# Patient Record
Sex: Female | Born: 1965 | Race: White | Hispanic: No | Marital: Married | Smoking: Never smoker
Health system: Southern US, Community
[De-identification: ages and names within clinical notes are randomized; demographics above are authoritative.]

## PROBLEM LIST (undated history)

## (undated) DIAGNOSIS — K5730 Diverticulosis of large intestine without perforation or abscess without bleeding: Secondary | ICD-10-CM

## (undated) DIAGNOSIS — R002 Palpitations: Secondary | ICD-10-CM

## (undated) DIAGNOSIS — N950 Postmenopausal bleeding: Secondary | ICD-10-CM

## (undated) DIAGNOSIS — Z860101 Personal history of adenomatous and serrated colon polyps: Secondary | ICD-10-CM

## (undated) DIAGNOSIS — N840 Polyp of corpus uteri: Secondary | ICD-10-CM

## (undated) DIAGNOSIS — C189 Malignant neoplasm of colon, unspecified: Secondary | ICD-10-CM

## (undated) DIAGNOSIS — R5383 Other fatigue: Secondary | ICD-10-CM

## (undated) DIAGNOSIS — Z8719 Personal history of other diseases of the digestive system: Secondary | ICD-10-CM

## (undated) DIAGNOSIS — Z86010 Personal history of colonic polyps: Secondary | ICD-10-CM

## (undated) DIAGNOSIS — G43909 Migraine, unspecified, not intractable, without status migrainosus: Secondary | ICD-10-CM

## (undated) DIAGNOSIS — IMO0002 Reserved for concepts with insufficient information to code with codable children: Secondary | ICD-10-CM

## (undated) DIAGNOSIS — I10 Essential (primary) hypertension: Secondary | ICD-10-CM

## (undated) HISTORY — DX: Reserved for concepts with insufficient information to code with codable children: IMO0002

## (undated) HISTORY — DX: Migraine, unspecified, not intractable, without status migrainosus: G43.909

## (undated) HISTORY — DX: Malignant neoplasm of colon, unspecified: C18.9

## (undated) HISTORY — PX: COLONOSCOPY: SHX174

## (undated) HISTORY — DX: Other fatigue: R53.83

---

## 1995-01-20 HISTORY — PX: DILATION AND CURETTAGE OF UTERUS: SHX78

## 1998-06-24 ENCOUNTER — Inpatient Hospital Stay (HOSPITAL_COMMUNITY): Admission: AD | Admit: 1998-06-24 | Discharge: 1998-06-24 | Payer: Self-pay | Admitting: Obstetrics and Gynecology

## 1998-07-11 ENCOUNTER — Inpatient Hospital Stay (HOSPITAL_COMMUNITY): Admission: AD | Admit: 1998-07-11 | Discharge: 1998-07-15 | Payer: Self-pay | Admitting: *Deleted

## 1998-08-30 ENCOUNTER — Other Ambulatory Visit: Admission: RE | Admit: 1998-08-30 | Discharge: 1998-08-30 | Payer: Self-pay | Admitting: Obstetrics and Gynecology

## 1999-11-21 ENCOUNTER — Other Ambulatory Visit: Admission: RE | Admit: 1999-11-21 | Discharge: 1999-11-21 | Payer: Self-pay | Admitting: Obstetrics and Gynecology

## 2011-11-05 ENCOUNTER — Ambulatory Visit (INDEPENDENT_AMBULATORY_CARE_PROVIDER_SITE_OTHER): Payer: 59 | Admitting: Obstetrics & Gynecology

## 2011-11-05 ENCOUNTER — Encounter: Payer: Self-pay | Admitting: Obstetrics & Gynecology

## 2011-11-05 VITALS — BP 137/103 | HR 95 | Temp 98.6°F | Resp 16 | Ht 62.0 in | Wt 216.0 lb

## 2011-11-05 DIAGNOSIS — Z0000 Encounter for general adult medical examination without abnormal findings: Secondary | ICD-10-CM

## 2011-11-05 DIAGNOSIS — Z01419 Encounter for gynecological examination (general) (routine) without abnormal findings: Secondary | ICD-10-CM

## 2011-11-05 DIAGNOSIS — Z124 Encounter for screening for malignant neoplasm of cervix: Secondary | ICD-10-CM

## 2011-11-05 DIAGNOSIS — Z1151 Encounter for screening for human papillomavirus (HPV): Secondary | ICD-10-CM

## 2011-11-05 DIAGNOSIS — N951 Menopausal and female climacteric states: Secondary | ICD-10-CM

## 2011-11-05 MED ORDER — NORETHIN ACE-ETH ESTRAD-FE 1-20 MG-MCG(24) PO TABS: 1.0000 | ORAL_TABLET | Freq: Every day | ORAL | Status: DC

## 2011-11-05 NOTE — Progress Notes (Signed)
Subjective:    Lauren Norris is a 46 y.o. female who presents for an annual exam. She complains of amenorrhea since 2009. Dr. Marcelle Overlie checked her [facility] and told her she is menopausal. She says that the Vivelle and prometrium were not helpful. The patient is sexually active. GYN screening history: last pap: was normal. The patient wears seatbelts: yes. The patient participates in regular exercise: yes. Has the patient ever been transfused or tattooed?: yes (tattoos). The patient reports that there is not domestic violence in her life.   Menstrual History: OB History    Grav Para Term Preterm Abortions TAB SAB Ect Mult Living   2 1 1  1  1   1       Menarche age: 30 No LMP recorded. Patient is postmenopausal.    The following portions of the patient's history were reviewed and updated as appropriate: allergies, current medications, past family history, past medical history, past social history, past surgical history and problem list.  Review of Systems A comprehensive review of systems was negative. She has been married for 20 years and denies dyspareunia. She is not employed. She previously used OCPs without problems.  Objective:    BP 137/103  Pulse 95  Temp 98.6 F (37 C) (Oral)  Resp 16  Ht 5\' 2"  (1.575 m)  Wt 216 lb (97.977 kg)  BMI 39.51 kg/m2  General Appearance:    Alert, cooperative, no distress, appears stated age  Head:    Normocephalic, without obvious abnormality, atraumatic  Eyes:    PERRL, conjunctiva/corneas clear, EOM's intact, fundi    benign, both eyes  Ears:    Normal TM's and external ear canals, both ears  Nose:   Nares normal, septum midline, mucosa normal, no drainage    or sinus tenderness  Throat:   Lips, mucosa, and tongue normal; teeth and gums normal  Neck:   Supple, symmetrical, trachea midline, no adenopathy;    thyroid:  no enlargement/tenderness/nodules; no carotid   bruit or JVD  Back:     Symmetric, no curvature, ROM normal, no CVA tenderness    Lungs:     Clear to auscultation bilaterally, respirations unlabored  Chest Wall:    No tenderness or deformity   Heart:    Regular rate and rhythm, S1 and S2 normal, no murmur, rub   or gallop  Breast Exam:    No tenderness, masses, or nipple abnormality  Abdomen:     Soft, non-tender, bowel sounds active all four quadrants,    no masses, no organomegaly  Genitalia:    Normal female without lesion, discharge or tenderness, mild atrophy, cervix very anterior in the vagina, Uterus 6-8 week size, NT, mobile, NT adnexa, no masses     Extremities:   Extremities normal, atraumatic, no cyanosis or edema  Pulses:   2+ and symmetric all extremities  Skin:   Skin color, texture, turgor normal, no rashes or lesions  Lymph nodes:   Cervical, supraclavicular, and axillary nodes normal  Neurologic:   CNII-XII intact, normal strength, sensation and reflexes    throughout  .    Assessment:    Healthy female exam.    Plan:     Mammogram. Thin prep Pap smear.  For her menopausal symptoms, I will prescribe ON 1/35. I will recheck her BP in 2 weeks and encourage weight loss.

## 2011-11-19 ENCOUNTER — Ambulatory Visit (INDEPENDENT_AMBULATORY_CARE_PROVIDER_SITE_OTHER): Payer: 59 | Admitting: Obstetrics & Gynecology

## 2011-11-19 ENCOUNTER — Encounter: Payer: Self-pay | Admitting: Obstetrics & Gynecology

## 2011-11-19 VITALS — BP 142/98 | HR 89 | Temp 98.5°F | Resp 16 | Ht 62.0 in | Wt 217.0 lb

## 2011-11-19 DIAGNOSIS — R232 Flushing: Secondary | ICD-10-CM

## 2011-11-19 DIAGNOSIS — I10 Essential (primary) hypertension: Secondary | ICD-10-CM

## 2011-11-19 DIAGNOSIS — N951 Menopausal and female climacteric states: Secondary | ICD-10-CM

## 2011-11-19 DIAGNOSIS — E669 Obesity, unspecified: Secondary | ICD-10-CM

## 2011-11-19 NOTE — Progress Notes (Signed)
  Subjective:    Patient ID: Lauren Norris, female    DOB: February 02, 1965, 46 y.o.   MRN: [id number]  HPI  Mrs. Charters is a 46 yo lady that I started on OCPs for her intractable hot flashes. She says that they are improved, present only on occasions instead of constantly. She would like to stay on them.  Review of Systems  She has gained a significant amount of weight in the last year.    Objective:   Physical Exam        Assessment & Plan:  HTN- I believe that with only a minimal amount of weight loss that her BP will normalize. We have discussed diet and exercise. I will give her a month to lose 5# and recheck her BP. If BP still up, she will have to d/c the OCPs or go on BP med.

## 2011-12-16 ENCOUNTER — Ambulatory Visit: Payer: 59 | Admitting: Obstetrics & Gynecology

## 2012-01-20 DIAGNOSIS — C189 Malignant neoplasm of colon, unspecified: Secondary | ICD-10-CM

## 2012-01-20 HISTORY — DX: Malignant neoplasm of colon, unspecified: C18.9

## 2012-07-15 ENCOUNTER — Other Ambulatory Visit: Payer: Self-pay | Admitting: Gastroenterology

## 2012-08-05 ENCOUNTER — Encounter (INDEPENDENT_AMBULATORY_CARE_PROVIDER_SITE_OTHER): Payer: Self-pay

## 2012-08-08 ENCOUNTER — Ambulatory Visit (INDEPENDENT_AMBULATORY_CARE_PROVIDER_SITE_OTHER): Payer: Medicaid Other | Admitting: General Surgery

## 2012-08-08 ENCOUNTER — Encounter (INDEPENDENT_AMBULATORY_CARE_PROVIDER_SITE_OTHER): Payer: Self-pay | Admitting: General Surgery

## 2012-08-08 ENCOUNTER — Telehealth (INDEPENDENT_AMBULATORY_CARE_PROVIDER_SITE_OTHER): Payer: Self-pay

## 2012-08-08 VITALS — BP 144/84 | HR 85 | Temp 97.1°F | Ht 62.0 in | Wt 236.2 lb

## 2012-08-08 DIAGNOSIS — K6389 Other specified diseases of intestine: Secondary | ICD-10-CM

## 2012-08-08 NOTE — Patient Instructions (Signed)
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 This is either cancerous or precancerous, and you will need to have an operation to have this removed.  You will be scheduled for a CT scan of the abdomen and pelvis preop.  You will need to undergo a bowel prep preop, which will include a mechanical laxative as well as antibiotic pills.  You'll be scheduled for surgery to remove this segment of the colon, and most likely you will not need a colostomy, although it is possible.       Laparoscopic Colon Resection Laparoscopic colon resection is a relatively new procedure and is not performed in all centers. It may be done to remove a piece of the colon (large intestine) that may be sore and reddened (inflamed). It may be done to remove a portion of bowel that is blocked. The intestine may be blocked because of colon cancer. It is sometimes used to treat diseases of the bowel in which there are multiple small outgrowths from the bowel wall (polyps), which may predispose a person to cancer. LET YOUR CAREGIVER KNOW ABOUT:  Allergies.  Medications taken including herbs, eye drops, over the counter medications, and creams.  Use of steroids (by mouth or creams).  Previous problems with anesthetics or novocaine.  Possibility of pregnancy, if this applies.  History of blood clots (thrombophlebitis).  History of bleeding or blood problems.  Previous surgery.  Other health problems. RISKS AND COMPLICATIONS Some problems, which occur following this procedure, include:  Infection: A germ starts growing in the wound. This can usually be treated with medicine that kills germs (antibiotics).  Bleeding following surgery may be a complication of almost all surgeries. Your surgeon takes every precaution to keep this from happening.  Damage to other organs may occur. If damage to other organs or excessive bleeding should occur it may be necessary to convert the laparoscopic procedure into an  open abdominal (belly) procedure. This means the surgery is performed by opening the abdomen and performing the surgery under direct vision. Scarring from previous surgeries or disease may also be a cause to change this procedure to an open abdominal operation.  Sometimes a leak can occur in the line where the bowel was sewn together after the portion of bowel was removed.  It is possible for the bowel to become obstructed in the area where it was sewn together. When this happens, it is sometimes necessary to operate again to repair this. This may be accomplished using the laparoscope or opening the abdomen and operating in the usual manner without the laparoscope. BEFORE THE PROCEDURE You should be present 2 hours prior to your procedure or as instructed.  PROCEDURE  Laparoscopic means a laparoscope (a small pencil sized telescope) is used. You are made to sleep with medicine (anesthetized). Your surgeon inflates your belly (abdomen) with a needle like device (trocar and cannula). The inflation is done with a harmless gas (carbon dioxide). This makes your organs easier to see. The laparoscope is inserted into your abdomen through a small slit (incision) that allows your surgeon to see into the abdomen. Other small instruments, such as probes and operating instruments, are inserted into the abdomen through other small openings (ports). These ports allow the surgeon to perform the operation. Often surgeons attach a video camera to the laparoscope to enlarge the view. During the procedure the portion of bowel to be removed is taken out through one of the ports. A port may have to be enlarged if the  bowel is too large to be removed. In this case a small incision will be made and some times the bowel is reconnected (anastamosis) outside the abdomen. After the procedure, the gas is released, and your incisions are closed with stitches (sutures). Because these incisions are small (usually less than one-half inch),  there is usually minimal discomfort following the procedure. AFTER THE PROCEDURE The recovery time, if there are no problems, is shortened compared to regular surgery. You will rest in a recovery room until you are stable and doing well. Following this, barring other problems you will be allowed to return to your room. Recovery times vary depending on what is found at surgery, the age of the patient, general health, etc. SEEK IMMEDIATE MEDICAL CARE IF:   There is redness, swelling, or increasing pain in the wound area.  Pus is coming from the wound.  An unexplained oral temperature above 102 F (38.9 C) develops or as directed.  You notice a foul smell coming from the wound or dressing.  There is a breaking open of a wound (edges not staying together) after sutures have been removed.  You develop increasing abdominal pain. Document Released: 03/28/2002 Document Revised: 03/30/2011 Document Reviewed: 02/04/2007 [facility] Patient Information 2014 [place], [place].      Removal of Colon (Colectomy) Care Before and After Surgery A partial or total colectomy is the removal of part or all of your colon. This is most often done under a general anesthetic so that you are sleeping during the procedure. Following the procedure, you will be taken to a recovery room. Once you have recovered from the anesthetic you will be returned to your room.  LET YOUR CAREGIVERS KNOW ABOUT:  Allergies  Medications taken including herbs, eye drops, over the counter medications, and creams.  Use of steroids (by mouth or creams).  Previous problems with anesthetics or Novocaine.  Possibility of pregnancy, if this applies.  History of blood clots (thrombophlebitis).  History of bleeding or blood problems.  Previous surgery.  Other health problems. AFTER THE PROCEDURE After surgery, you will be taken to the recovery area where a nurse will watch you and check your progress. After the recovery area you  will go to your hospital room. Your surgeon will determine when it is alright for you to take fluids and foods. You will be given pain medicine to keep you comfortable. HOME CARE INSTRUCTIONS  Once home, an ice pack applied to the operative site may help with discomfort and keep swelling down.  Change dressings as directed.  Only take over-the-counter or prescription medicines for pain, discomfort, or fever as directed by your caregiver.  You may continue normal diet and activities as directed.  There should be no heavy lifting (more than 10 pounds), strenuous activities or contact sports for three weeks, or as directed.  Keep the wound dry and clean. The wound may be washed gently with soap and water. Gently blot or dab dry following cleansing without rubbing. Do not take baths, use swimming pools or use hot tubs for ten days, or as instructed by your caregivers.  If you have a colostomy, care for it as you have been shown. SEEK MEDICAL CARE IF:   There is redness, swelling, or increasing pain in the wound area.  Pus is coming from the wound.  An unexplained oral temperature above 101 F (38.3 C) develops.  You notice a foul smell coming from the wound or dressing.  There is a breaking open of a wound (  edges not staying together) after the sutures have been removed.  There is increasing abdominal pain. SEEK IMMEDIATE MEDICAL CARE IF:   A rash develops.  There is difficulty breathing, or development of a reaction or side effects to medications given. Document Released: 10/10/2003 Document Revised: 03/30/2011 Document Reviewed: 02/08/2007 [facility] Patient Information 2014 [place], [place].

## 2012-08-08 NOTE — Telephone Encounter (Signed)
I called and left the pt a message to call me.  Dr Derrell Lolling is running late getting here.  I was going to tell her not to arrive until 4:30 to check in.  There is one pt ahead of her.  I see she is coming from [facility] though.

## 2012-08-08 NOTE — Progress Notes (Addendum)
Patient ID: Lauren Norris, female   DOB: November 29, 1965, 47 y.o.   MRN: [id number]  Chief Complaint  Patient presents with  . New Evaluation    eval colon mass    HPI Lauren Norris is a 47 y.o. female.  She is referred by Dr. Charlott Rakes for evaluation andsurgical management of a neoplastic mass of the transverse colon. Dr. Richarda Overlie is her gynecologist and PCP.  I have operated on her father, Mr. [name], for colon cancer. He is currently doing well following chemotherapy.  This patient does not have any GI symptoms. Specifically no bowel pain cramps nausea vomiting change in bowel habit or blood in her stool. Has occasional heartburn.  Her father had colon cancer at age 73. A paternal uncle was operated on for colon cancer and is currently doing well. Paternal grandmother died of metastatic colon cancer. She was referred for her first screening colonoscopy recently. Dr. Bosie Clos performed colonoscopy to the cecum and describes a polypoid, infiltrated, nonobstructing, large mass in the transverse colon. This was partially circumferential, involving one half of the lumen circumference. This area was tattooed biopsies were taken. 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 This was a solitary mucosal abnormality.  She has had no further testing.  She is generally healthy although has gained a lot of weight in the past year because she lost her job. Her BMI is 42. She has had a C-section. No other medical problems. She is married and has one 40 year old daughter. HPI  Past Medical History  Diagnosis Date  . Fatigue   . Migraine   . Palpitations   . Hot flashes   . Recurrent UTI   . Bulging disc     Past Surgical History  Procedure Laterality Date  . Dilation and curettage of uterus    . Cesarean section  07/12/1998    Family History  Problem Relation Age of Onset  . Cancer Paternal Grandmother     Colon  . Cancer - Colon Father   . Cancer Father    Colon  . Cancer - Other Maternal Aunt     myloma  . Cancer Maternal Aunt     Multi Myeloma  . Cancer - Lung Paternal Grandfather   . Cancer Paternal Grandfather     Lung  . Cancer - Colon Paternal Uncle   . Cancer Paternal Uncle     Colon  . Cancer Maternal Grandmother     Ovarian    Social History History  Substance Use Topics  . Smoking status: Former Games developer  . Smokeless tobacco: Former Neurosurgeon    Quit date: 08/09/1998  . Alcohol Use: Yes     Comment: 2-3 times week    Allergies  Allergen Reactions  . Codeine     Upset stomach    Current Outpatient Prescriptions  Medication Sig Dispense Refill  . alprazolam (XANAX) 2 MG tablet Take 2 mg by mouth daily as needed for sleep.      Marland Kitchen estradiol (VIVELLE-DOT) 0.0375 MG/24HR Place 1 patch onto the skin 2 (two) times a week.       No current facility-administered medications for this visit.    Review of Systems Review of Systems  Constitutional: Positive for unexpected weight change. Negative for fever and chills.  HENT: Negative for hearing loss, congestion, sore throat, trouble swallowing and voice change.   Eyes: Negative for visual disturbance.  Respiratory: Negative for cough and wheezing.   Cardiovascular: Negative for chest pain, palpitations and  leg swelling.  Gastrointestinal: Negative for nausea, vomiting, abdominal pain, diarrhea, constipation, blood in stool, abdominal distention and anal bleeding.       Reflux  Genitourinary: Negative for hematuria, vaginal bleeding and difficulty urinating.  Musculoskeletal: Negative for arthralgias.  Skin: Negative for rash and wound.  Neurological: Positive for headaches. Negative for seizures and syncope.  Hematological: Negative for adenopathy. Does not bruise/bleed easily.  Psychiatric/Behavioral: Negative for confusion. The patient is nervous/anxious.     Blood pressure 144/84, pulse 85, temperature 97.1 F (36.2 C), temperature source Temporal, height 5\' 2"  (1.575  m), weight 236 lb 3.2 oz (107.14 kg), SpO2 98.00%.  Physical Exam Physical Exam  Constitutional: She is oriented to person, place, and time. She appears well-developed and well-nourished. No distress.  BMI 42.06  HENT:  Head: Normocephalic and atraumatic.  Nose: Nose normal.  Mouth/Throat: No oropharyngeal exudate.  Eyes: Conjunctivae and EOM are normal. Pupils are equal, round, and reactive to light. Left eye exhibits no discharge. No scleral icterus.  Neck: Neck supple. No JVD present. No tracheal deviation present. No thyromegaly present.  Cardiovascular: Normal rate, regular rhythm, normal heart sounds and intact distal pulses.   No murmur heard. Pulmonary/Chest: Effort normal and breath sounds normal. No respiratory distress. She has no wheezes. She has no rales. She exhibits no tenderness.  Abdominal: Soft. Bowel sounds are normal. She exhibits no distension and no mass. There is no tenderness. There is no rebound and no guarding.  pfannensteil scar.central obesity.  Musculoskeletal: She exhibits no edema and no tenderness.  Lymphadenopathy:    She has no cervical adenopathy.  Neurological: She is alert and oriented to person, place, and time. She exhibits normal muscle tone. Coordination normal.  Skin: Skin is warm. No rash noted. She is not diaphoretic. No erythema. No pallor.  Psychiatric: She has a normal mood and affect. Her behavior is normal. Judgment and thought content normal.    Data Reviewed Office notes. Colonoscopy report. Pathology report.  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 This should be treated as a carcinoma until proven otherwise.  Strong family history of colon cancer on the father's side.  Significant obesity, BMI 42.06  History Pfannenstiel incision for C-section     Plan    The patient will be scheduled for CT scan of the  abdomen and pelvis this week ADDENDUM:  CT shows mass in mid-transverse colon. No adenopathy or metastatic disease.  She'll be scheduled for laparoscopic assisted transverse colectomy, possible open colectomy in the near future. She is aware of the increased risk of converting to open because of her central obesity and she accepts that.  I have discussed indications, details, techniques, numerous risks of the surgery with her. She is aware of the risk of bleeding, infection, wound healing problems, hernia, injury to adjacent organs, conversion to a major laparotomy with large incision, cardiac, pulmonary, and thromboembolic problems. She understands all these issues and all of her questions are answered. She agrees with this plan.  She will undergo a one-day bowel prep  Consider genetic testing at some point, although not urgent since this is a solitary finding and not yet proven to be cancer.  Weight loss recommended        Adabelle Griffiths M. Derrell Lolling, M.D., [facility] General and Minimally invasive Surgery Breast and Colorectal Surgery Office:   [phone] Pager:   [phone]  08/08/2012, 5:20 PM

## 2012-08-09 ENCOUNTER — Telehealth (INDEPENDENT_AMBULATORY_CARE_PROVIDER_SITE_OTHER): Payer: Self-pay | Admitting: General Surgery

## 2012-08-09 NOTE — Telephone Encounter (Signed)
Spoke with pt and informed her of her CT scan on 08/23/12 at 10:00.  Instructed no solids 4 hrs prior

## 2012-08-23 ENCOUNTER — Ambulatory Visit
Admission: RE | Admit: 2012-08-23 | Discharge: 2012-08-23 | Disposition: A | Discharge status: Home / Self Care | Payer: Medicaid Other | Source: Ambulatory Visit | Attending: General Surgery | Admitting: General Surgery

## 2012-08-23 DIAGNOSIS — K6389 Other specified diseases of intestine: Secondary | ICD-10-CM

## 2012-08-24 ENCOUNTER — Encounter (HOSPITAL_COMMUNITY): Payer: Self-pay | Admitting: Pharmacy Technician

## 2012-08-24 ENCOUNTER — Ambulatory Visit
Admission: RE | Admit: 2012-08-24 | Discharge: 2012-08-24 | Disposition: A | Discharge status: Home / Self Care | Payer: Medicaid Other | Source: Ambulatory Visit | Attending: General Surgery | Admitting: General Surgery

## 2012-08-24 MED ORDER — IOHEXOL 300 MG/ML  SOLN
100.0000 mL | Freq: Once | INTRAMUSCULAR | Status: AC | PRN
  Administered 2012-08-24: 100 mL via INTRAVENOUS

## 2012-08-26 ENCOUNTER — Telehealth (INDEPENDENT_AMBULATORY_CARE_PROVIDER_SITE_OTHER): Payer: Self-pay

## 2012-08-26 NOTE — Telephone Encounter (Signed)
Patient is waiting for return call home all day

## 2012-08-26 NOTE — Telephone Encounter (Signed)
Message copied by Ivory Broad on Fri Aug 26, 2012  2:17 PM ------      Message from: Ernestene Mention      Created: Thu Aug 25, 2012  5:26 PM       [place] Dec,      Please call  CT report to patient. Tell her there are no surprises. Transverse colon mass exactly where we expected it. No signs of cancer elsewhere.            hmi            ----- Message -----         From: Rad Results In Interface         Sent: 08/24/2012   1:51 PM           To: Ernestene Mention, MD                   ------

## 2012-08-26 NOTE — Telephone Encounter (Signed)
I called and gave her the CT results.

## 2012-08-26 NOTE — Telephone Encounter (Signed)
I left a message for the pt to call me.  I want to give her the CT results below.

## 2012-08-30 ENCOUNTER — Encounter (INDEPENDENT_AMBULATORY_CARE_PROVIDER_SITE_OTHER): Payer: Self-pay

## 2012-09-01 ENCOUNTER — Other Ambulatory Visit: Payer: Self-pay

## 2012-09-01 ENCOUNTER — Encounter (HOSPITAL_COMMUNITY): Payer: Self-pay

## 2012-09-01 ENCOUNTER — Ambulatory Visit (HOSPITAL_COMMUNITY)
Admission: RE | Admit: 2012-09-01 | Discharge: 2012-09-01 | Disposition: A | Discharge status: Home / Self Care | Payer: Medicaid Other | Source: Ambulatory Visit | Attending: General Surgery | Admitting: General Surgery

## 2012-09-01 ENCOUNTER — Encounter (HOSPITAL_COMMUNITY)
Admission: RE | Admit: 2012-09-01 | Discharge: 2012-09-01 | Disposition: A | Discharge status: Home / Self Care | Payer: Medicaid Other | Source: Ambulatory Visit | Attending: General Surgery | Admitting: General Surgery

## 2012-09-01 DIAGNOSIS — Z01812 Encounter for preprocedural laboratory examination: Secondary | ICD-10-CM | POA: Insufficient documentation

## 2012-09-01 DIAGNOSIS — K6389 Other specified diseases of intestine: Secondary | ICD-10-CM | POA: Insufficient documentation

## 2012-09-01 DIAGNOSIS — Z01818 Encounter for other preprocedural examination: Secondary | ICD-10-CM | POA: Insufficient documentation

## 2012-09-01 LAB — URINALYSIS, ROUTINE W REFLEX MICROSCOPIC
Bilirubin Urine: NEGATIVE
Hgb urine dipstick: NEGATIVE
Specific Gravity, Urine: 1.031 — ABNORMAL HIGH (ref 1.005–1.030)
Urobilinogen, UA: 0.2 mg/dL (ref 0.0–1.0)

## 2012-09-01 LAB — COMPREHENSIVE METABOLIC PANEL
ALT: 35 U/L (ref 0–35)
AST: 21 U/L (ref 0–37)
CO2: 27 mEq/L (ref 19–32)
Chloride: 105 mEq/L (ref 96–112)
GFR calc non Af Amer: >90 mL/min (ref >90)
Potassium: 3.7 mEq/L (ref 3.5–5.1)
Sodium: 137 mEq/L (ref 135–145)
Total Bilirubin: 0.6 mg/dL (ref 0.3–1.2)

## 2012-09-01 LAB — CBC WITH DIFFERENTIAL/PLATELET
Lymphocytes Relative: 37 % (ref 12–46)
Lymphs Abs: 2.3 10*3/uL (ref 0.7–4.0)
Neutrophils Relative %: 52 % (ref 43–77)
Platelets: 270 10*3/uL (ref 150–400)
RBC: 4.74 MIL/uL (ref 3.87–5.11)
WBC: 6.2 10*3/uL (ref 4.0–10.5)

## 2012-09-01 LAB — URINE MICROSCOPIC-ADD ON

## 2012-09-01 LAB — CEA: CEA: <0.5 ng/mL (ref 0.0–5.0)

## 2012-09-01 NOTE — Pre-Procedure Instructions (Signed)
EKG AND CXR WERE DONE PREOP TODAY AT [facility] - AS PER ORDERS DR. INGRAM

## 2012-09-01 NOTE — Patient Instructions (Signed)
YOUR SURGERY IS SCHEDULED AT [facility]  ON:  Wednesday  8/20  REPORT TO [facility] AT:  10:30 AM      PHONE # FOR SHORT STAY IS [phone]  FOLLOW ANY BOWEL PREP INSTRUCTIONS GIVEN BY DR. Jacinto Halim OFFICE.  DO NOT EAT OR DRINK ANYTHING AFTER MIDNIGHT THE NIGHT BEFORE YOUR SURGERY.  YOU MAY BRUSH YOUR TEETH, RINSE OUT YOUR MOUTH--BUT NO WATER, NO FOOD, NO CHEWING GUM, NO MINTS, NO CANDIES, NO CHEWING TOBACCO.  PLEASE TAKE THE FOLLOWING MEDICATIONS THE AM OF YOUR SURGERY WITH A FEW SIPS OF WATER:  NO MEDICINES TO TAKE - MAY WEAR VIVELLE DOT PATCH.    DO NOT BRING VALUABLES, MONEY, CREDIT CARDS.  DO NOT WEAR JEWELRY, MAKE-UP, NAIL POLISH AND NO METAL PINS OR CLIPS IN YOUR HAIR. CONTACT LENS, DENTURES / PARTIALS, GLASSES SHOULD NOT BE WORN TO SURGERY AND IN MOST CASES-HEARING AIDS WILL NEED TO BE REMOVED.  BRING YOUR GLASSES CASE, ANY EQUIPMENT NEEDED FOR YOUR CONTACT LENS. FOR PATIENTS ADMITTED TO THE HOSPITAL--CHECK OUT TIME THE DAY OF DISCHARGE IS 11:00 AM.  ALL INPATIENT ROOMS ARE PRIVATE - WITH BATHROOM, TELEPHONE, TELEVISION AND WIFI INTERNET.                               PLEASE READ OVER ANY  FACT SHEETS THAT YOU WERE GIVEN: BLOOD TRANSFUSION INFORMATION FAILURE TO FOLLOW THESE INSTRUCTIONS MAY RESULT IN THE CANCELLATION OF YOUR SURGERY.   PATIENT SIGNATURE_________________________________

## 2012-09-05 NOTE — H&P (Signed)
Lauren Norris   MRN:  [id number]   Description: 47 year old female  Provider: Ernestene Mention, MD  Department: [facility]        Diagnoses    Mass of colon    -  Primary    [phone]    Neoplastic mass of transverse colon, biopsies showed high-grade dysplasia.    Severe obesity (BMI >= 40)        278.01    Mass of colon        569.89      Reason for Visit    New Evaluation    eval colon mass        Current Vitals   BP Pulse Temp(Src) Ht Wt BMI    144/84 85 97.1 F (36.2 C) (Temporal) 5\' 2"  (1.575 m) 236 lb 3.2 oz (107.14 kg) 43.19 kg/m2                 History and Physical    Ernestene Mention, MD   Status: Addendum                             HPI Lauren Norris is a 47 y.o. female.  She is referred by Dr. Charlott Rakes for evaluation andsurgical management of a neoplastic mass of the transverse colon. Dr. Richarda Overlie is her gynecologist and PCP.  I have operated on her father, Mr. Lauren Norris, for colon cancer. He is currently doing well following chemotherapy.   This patient does not have any GI symptoms. Specifically no bowel pain cramps nausea vomiting change in bowel habit or blood in her stool. Has occasional heartburn.   Her father had colon cancer at age 8. A paternal uncle was operated on for colon cancer and is currently doing well. Paternal grandmother died of metastatic colon cancer. She was referred for her first screening colonoscopy recently. Dr. Bosie Clos performed colonoscopy to the cecum and describes a polypoid, infiltrated, nonobstructing, large mass in the transverse colon. This was partially circumferential, involving one half of the lumen circumference. This area was tattooed biopsies were taken. 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 This was a solitary mucosal abnormality.  She has had no further testing.   She is generally healthy although has gained a lot of weight in the past year because she  lost her job. Her BMI is 42. She has had a C-section. No other medical problems. She is married and has one 83 year old daughter.       Past Medical History   Diagnosis  Date   .  Fatigue     .  Migraine     .  Palpitations     .  Hot flashes     .  Recurrent UTI     .  Bulging disc           Past Surgical History   Procedure  Laterality  Date   .  Dilation and curettage of uterus       .  Cesarean section    07/12/1998         Family History   Problem  Relation  Age of Onset   .  Cancer  Paternal Grandmother         Colon   .  Cancer - Colon  Father     .  Cancer  Father         Colon   .  Cancer - Other  Maternal Aunt         myloma   .  Cancer  Maternal Aunt         Multi Myeloma   .  Cancer - Lung  Paternal Grandfather     .  Cancer  Paternal Grandfather         Lung   .  Cancer - Colon  Paternal Uncle     .  Cancer  Paternal Uncle         Colon   .  Cancer  Maternal Grandmother         Ovarian        Social History History   Substance Use Topics   .  Smoking status:  Former Games developer   .  Smokeless tobacco:  Former Neurosurgeon       Quit date:  08/09/1998   .  Alcohol Use:  Yes         Comment: 2-3 times week         Allergies   Allergen  Reactions   .  Codeine         Upset stomach         Current Outpatient Prescriptions   Medication  Sig  Dispense  Refill   .  alprazolam (XANAX) 2 MG tablet  Take 2 mg by mouth daily as needed for sleep.         Marland Kitchen  estradiol (VIVELLE-DOT) 0.0375 MG/24HR  Place 1 patch onto the skin 2 (two) times a week.             .        Review of Systems   Constitutional: Positive for unexpected weight change. Negative for fever and chills.  HENT: Negative for hearing loss, congestion, sore throat, trouble swallowing and voice change.   Eyes: Negative for visual disturbance.  Respiratory: Negative for cough and wheezing.   Cardiovascular: Negative for chest pain, palpitations and leg swelling.  Gastrointestinal:  Negative for nausea, vomiting, abdominal pain, diarrhea, constipation, blood in stool, abdominal distention and anal bleeding.        Reflux  Genitourinary: Negative for hematuria, vaginal bleeding and difficulty urinating.  Musculoskeletal: Negative for arthralgias.  Skin: Negative for rash and wound.  Neurological: Positive for headaches. Negative for seizures and syncope.  Hematological: Negative for adenopathy. Does not bruise/bleed easily.  Psychiatric/Behavioral: Negative for confusion. The patient is nervous/anxious.       Blood pressure 144/84, pulse 85, temperature 97.1 F (36.2 C), temperature source Temporal, height 5\' 2"  (1.575 m), weight 236 lb 3.2 oz (107.14 kg), SpO2 98.00%.   Physical Exam   Constitutional: She is oriented to person, place, and time. She appears well-developed and well-nourished. No distress.  BMI 42.06  HENT:   Head: Normocephalic and atraumatic.   Nose: Nose normal.   Mouth/Throat: No oropharyngeal exudate.  Eyes: Conjunctivae and EOM are normal. Pupils are equal, round, and reactive to light. Left eye exhibits no discharge. No scleral icterus.  Neck: Neck supple. No JVD present. No tracheal deviation present. No thyromegaly present.  Cardiovascular: Normal rate, regular rhythm, normal heart sounds and intact distal pulses.    No murmur heard. Pulmonary/Chest: Effort normal and breath sounds normal. No respiratory distress. She has no wheezes. She has no rales. She exhibits no tenderness.  Abdominal: Soft. Bowel sounds are normal. She exhibits no distension and no mass. There is no tenderness. There is no rebound and no guarding.  pfannensteil  scar.central obesity.  Musculoskeletal: She exhibits no edema and no tenderness.  Lymphadenopathy:    She has no cervical adenopathy.  Neurological: She is alert and oriented to person, place, and time. She exhibits normal muscle tone. Coordination normal.  Skin: Skin is warm. No rash noted. She is not  diaphoretic. No erythema. No pallor.  Psychiatric: She has a normal mood and affect. Her behavior is normal. Judgment and thought content normal.      Data Reviewed Office notes. Colonoscopy report. Pathology report.   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 This should be treated as a carcinoma until proven otherwise.   Strong family history of colon cancer on the father's side.   Significant obesity, BMI 42.06   History Pfannenstiel incision for C-section      Plan    The patient will be scheduled for CT scan of the abdomen and pelvis this week ADDENDUM:  CT shows mass in mid-transverse colon. No adenopathy or metastatic disease.   She'll be scheduled for laparoscopic assisted transverse colectomy, possible open colectomy in the near future. She is aware of the increased risk of converting to open because of her central obesity and she accepts that.   I have discussed indications, details, techniques, numerous risks of the surgery with her. She is aware of the risk of bleeding, infection, wound healing problems, hernia, injury to adjacent organs, conversion to a major laparotomy with large incision, cardiac, pulmonary, and thromboembolic problems. She understands all these issues and all of her questions are answered. She agrees with this plan.   She will undergo a one-day bowel prep   Consider genetic testing at some point, although not urgent since this is a solitary finding and not yet proven to be cancer.   Weight loss recommended           Norris Brumbach M. Derrell Lolling, M.D., [facility] General and Minimally invasive Surgery Breast and Colorectal Surgery Office:   [phone] Pager:   [phone]  m - Simple    No data filed

## 2012-09-06 MED ORDER — DEXTROSE 5 % IV SOLN
2.0000 g | INTRAVENOUS | Status: AC
  Administered 2012-09-07: 2 g via INTRAVENOUS
  Filled 2012-09-06: qty 2

## 2012-09-07 ENCOUNTER — Encounter (HOSPITAL_COMMUNITY): Payer: Self-pay | Admitting: Anesthesiology

## 2012-09-07 ENCOUNTER — Inpatient Hospital Stay (HOSPITAL_COMMUNITY)
Admission: RE | Admit: 2012-09-07 | Discharge: 2012-09-12 | DRG: 330 | Disposition: A | Discharge status: Home / Self Care | Payer: Medicaid Other | Source: Ambulatory Visit | Attending: General Surgery | Admitting: General Surgery

## 2012-09-07 ENCOUNTER — Ambulatory Visit (HOSPITAL_COMMUNITY): Payer: Medicaid Other | Admitting: Anesthesiology

## 2012-09-07 ENCOUNTER — Encounter (HOSPITAL_COMMUNITY): Payer: Self-pay | Admitting: *Deleted

## 2012-09-07 ENCOUNTER — Encounter (HOSPITAL_COMMUNITY)
Admission: RE | Disposition: A | Discharge status: Home / Self Care | Payer: Self-pay | Source: Ambulatory Visit | Attending: General Surgery

## 2012-09-07 DIAGNOSIS — K6389 Other specified diseases of intestine: Secondary | ICD-10-CM

## 2012-09-07 DIAGNOSIS — I10 Essential (primary) hypertension: Secondary | ICD-10-CM | POA: Diagnosis present

## 2012-09-07 DIAGNOSIS — K560 Paralytic ileus: Secondary | ICD-10-CM | POA: Diagnosis not present

## 2012-09-07 DIAGNOSIS — Z800 Family history of malignant neoplasm of digestive organs: Secondary | ICD-10-CM

## 2012-09-07 DIAGNOSIS — Z87440 Personal history of urinary (tract) infections: Secondary | ICD-10-CM

## 2012-09-07 DIAGNOSIS — K929 Disease of digestive system, unspecified: Secondary | ICD-10-CM | POA: Diagnosis not present

## 2012-09-07 DIAGNOSIS — Y832 Surgical operation with anastomosis, bypass or graft as the cause of abnormal reaction of the patient, or of later complication, without mention of misadventure at the time of the procedure: Secondary | ICD-10-CM | POA: Diagnosis not present

## 2012-09-07 DIAGNOSIS — Z6841 Body Mass Index (BMI) 40.0 and over, adult: Secondary | ICD-10-CM

## 2012-09-07 DIAGNOSIS — Z87891 Personal history of nicotine dependence: Secondary | ICD-10-CM

## 2012-09-07 DIAGNOSIS — K639 Disease of intestine, unspecified: Secondary | ICD-10-CM | POA: Diagnosis present

## 2012-09-07 DIAGNOSIS — C184 Malignant neoplasm of transverse colon: Principal | ICD-10-CM | POA: Diagnosis present

## 2012-09-07 DIAGNOSIS — Z9221 Personal history of antineoplastic chemotherapy: Secondary | ICD-10-CM

## 2012-09-07 DIAGNOSIS — C189 Malignant neoplasm of colon, unspecified: Secondary | ICD-10-CM

## 2012-09-07 HISTORY — PX: LAPAROSCOPIC PARTIAL COLECTOMY: SHX5907

## 2012-09-07 LAB — TYPE AND SCREEN: Antibody Screen: NEGATIVE

## 2012-09-07 SURGERY — LAPAROSCOPIC PARTIAL COLECTOMY
Anesthesia: General | Site: Abdomen | Wound class: Clean Contaminated

## 2012-09-07 MED ORDER — PROMETHAZINE HCL 25 MG/ML IJ SOLN: 6.2500 mg | INTRAMUSCULAR | Status: DC | PRN

## 2012-09-07 MED ORDER — BUPIVACAINE-EPINEPHRINE 0.5% -1:200000 IJ SOLN
INTRAMUSCULAR | Status: DC | PRN
  Administered 2012-09-07: 20 mL

## 2012-09-07 MED ORDER — DIPHENHYDRAMINE HCL 12.5 MG/5ML PO ELIX: 12.5000 mg | ORAL_SOLUTION | Freq: Four times a day (QID) | ORAL | Status: DC | PRN

## 2012-09-07 MED ORDER — ALPRAZOLAM 1 MG PO TABS
2.0000 mg | ORAL_TABLET | Freq: Every evening | ORAL | Status: DC | PRN
  Administered 2012-09-11: 2 mg via ORAL
  Filled 2012-09-07: qty 2

## 2012-09-07 MED ORDER — LACTATED RINGERS IV SOLN
INTRAVENOUS | Status: DC
  Administered 2012-09-07: 1000 mL via INTRAVENOUS
  Administered 2012-09-07 (×2): via INTRAVENOUS

## 2012-09-07 MED ORDER — ESTRADIOL 0.05 MG/24HR TD PTWK
0.0500 mg | MEDICATED_PATCH | TRANSDERMAL | Status: DC
  Administered 2012-09-12: 0.05 mg via TRANSDERMAL
  Filled 2012-09-07: qty 1

## 2012-09-07 MED ORDER — POTASSIUM CHLORIDE IN NACL 20-0.9 MEQ/L-% IV SOLN
INTRAVENOUS | Status: AC
  Filled 2012-09-07: qty 1000

## 2012-09-07 MED ORDER — MIDAZOLAM HCL 5 MG/5ML IJ SOLN
INTRAMUSCULAR | Status: DC | PRN
  Administered 2012-09-07: 2 mg via INTRAVENOUS

## 2012-09-07 MED ORDER — 0.9 % SODIUM CHLORIDE (POUR BTL) OPTIME
TOPICAL | Status: DC | PRN
  Administered 2012-09-07: 2000 mL

## 2012-09-07 MED ORDER — CISATRACURIUM BESYLATE (PF) 10 MG/5ML IV SOLN
INTRAVENOUS | Status: DC | PRN
  Administered 2012-09-07: 2 mg via INTRAVENOUS
  Administered 2012-09-07: 4 mg via INTRAVENOUS
  Administered 2012-09-07: 14 mg via INTRAVENOUS

## 2012-09-07 MED ORDER — GLYCOPYRROLATE 0.2 MG/ML IJ SOLN
INTRAMUSCULAR | Status: DC | PRN
  Administered 2012-09-07: 0.6 mg via INTRAVENOUS

## 2012-09-07 MED ORDER — LACTATED RINGERS IR SOLN
Status: DC | PRN
  Administered 2012-09-07: 1

## 2012-09-07 MED ORDER — HYDROMORPHONE HCL PF 1 MG/ML IJ SOLN
INTRAMUSCULAR | Status: AC
  Filled 2012-09-07: qty 1

## 2012-09-07 MED ORDER — ALVIMOPAN 12 MG PO CAPS
12.0000 mg | ORAL_CAPSULE | Freq: Once | ORAL | Status: AC
  Administered 2012-09-07: 12 mg via ORAL
  Filled 2012-09-07: qty 1

## 2012-09-07 MED ORDER — FENTANYL CITRATE 0.05 MG/ML IJ SOLN
INTRAMUSCULAR | Status: DC | PRN
  Administered 2012-09-07 (×2): 100 ug via INTRAVENOUS
  Administered 2012-09-07: 50 ug via INTRAVENOUS
  Administered 2012-09-07 (×4): 100 ug via INTRAVENOUS

## 2012-09-07 MED ORDER — HEPARIN SODIUM (PORCINE) 5000 UNIT/ML IJ SOLN
5000.0000 [IU] | Freq: Once | INTRAMUSCULAR | Status: AC
  Administered 2012-09-07: 5000 [IU] via SUBCUTANEOUS
  Filled 2012-09-07: qty 1

## 2012-09-07 MED ORDER — POTASSIUM CHLORIDE IN NACL 20-0.9 MEQ/L-% IV SOLN
INTRAVENOUS | Status: DC
  Administered 2012-09-07: 17:00:00 via INTRAVENOUS
  Administered 2012-09-08: 75 mL/h via INTRAVENOUS
  Administered 2012-09-08: 125 mL/h via INTRAVENOUS
  Administered 2012-09-08 – 2012-09-09 (×2): via INTRAVENOUS
  Administered 2012-09-10 (×2): 75 mL/h via INTRAVENOUS
  Administered 2012-09-11: 04:00:00 via INTRAVENOUS
  Administered 2012-09-11: 20 mL/h via INTRAVENOUS
  Filled 2012-09-07 (×9): qty 1000

## 2012-09-07 MED ORDER — SODIUM CHLORIDE 0.9 % IJ SOLN: 9.0000 mL | INTRAMUSCULAR | Status: DC | PRN

## 2012-09-07 MED ORDER — LACTATED RINGERS IV SOLN: INTRAVENOUS | Status: DC

## 2012-09-07 MED ORDER — BUPIVACAINE-EPINEPHRINE (PF) 0.5% -1:200000 IJ SOLN
INTRAMUSCULAR | Status: AC
  Filled 2012-09-07: qty 10

## 2012-09-07 MED ORDER — ONDANSETRON HCL 4 MG/2ML IJ SOLN
INTRAMUSCULAR | Status: DC | PRN
  Administered 2012-09-07: 4 mg via INTRAVENOUS

## 2012-09-07 MED ORDER — HYDROMORPHONE HCL PF 1 MG/ML IJ SOLN
INTRAMUSCULAR | Status: DC | PRN
  Administered 2012-09-07 (×2): 1 mg via INTRAVENOUS

## 2012-09-07 MED ORDER — ALVIMOPAN 12 MG PO CAPS
12.0000 mg | ORAL_CAPSULE | Freq: Two times a day (BID) | ORAL | Status: DC
  Administered 2012-09-08 – 2012-09-11 (×6): 12 mg via ORAL
  Filled 2012-09-07 (×8): qty 1

## 2012-09-07 MED ORDER — ONDANSETRON HCL 4 MG/2ML IJ SOLN: 4.0000 mg | Freq: Four times a day (QID) | INTRAMUSCULAR | Status: DC | PRN

## 2012-09-07 MED ORDER — DEXTROSE 5 % IV SOLN
2.0000 g | Freq: Two times a day (BID) | INTRAVENOUS | Status: AC
  Administered 2012-09-07: 2 g via INTRAVENOUS
  Filled 2012-09-07: qty 2

## 2012-09-07 MED ORDER — HYDROMORPHONE HCL PF 1 MG/ML IJ SOLN
0.2500 mg | INTRAMUSCULAR | Status: DC | PRN
  Administered 2012-09-07 (×4): 0.5 mg via INTRAVENOUS

## 2012-09-07 MED ORDER — PROPOFOL 10 MG/ML IV BOLUS
INTRAVENOUS | Status: DC | PRN
  Administered 2012-09-07: 200 mg via INTRAVENOUS

## 2012-09-07 MED ORDER — NEOSTIGMINE METHYLSULFATE 1 MG/ML IJ SOLN
INTRAMUSCULAR | Status: DC | PRN
  Administered 2012-09-07: 4 mg via INTRAVENOUS

## 2012-09-07 MED ORDER — FENTANYL CITRATE 0.05 MG/ML IJ SOLN
50.0000 ug | INTRAMUSCULAR | Status: DC | PRN
  Administered 2012-09-07 (×2): 50 ug via INTRAVENOUS

## 2012-09-07 MED ORDER — NALOXONE HCL 0.4 MG/ML IJ SOLN: 0.4000 mg | INTRAMUSCULAR | Status: DC | PRN

## 2012-09-07 MED ORDER — MEPERIDINE HCL 50 MG/ML IJ SOLN: 6.2500 mg | INTRAMUSCULAR | Status: DC | PRN

## 2012-09-07 MED ORDER — HYDROMORPHONE 0.3 MG/ML IV SOLN
INTRAVENOUS | Status: DC
  Administered 2012-09-07: 1.4 mg via INTRAVENOUS
  Administered 2012-09-07: 16:00:00 via INTRAVENOUS
  Administered 2012-09-08: 0.6 mg via INTRAVENOUS
  Administered 2012-09-08: 2.1 mg via INTRAVENOUS
  Administered 2012-09-08 (×4): 1.5 mg via INTRAVENOUS
  Administered 2012-09-09: 0.6 mg via INTRAVENOUS
  Administered 2012-09-09: 0.9 mg via INTRAVENOUS
  Filled 2012-09-07: qty 25

## 2012-09-07 MED ORDER — DEXAMETHASONE SODIUM PHOSPHATE 10 MG/ML IJ SOLN
INTRAMUSCULAR | Status: DC | PRN
  Administered 2012-09-07: 10 mg via INTRAVENOUS

## 2012-09-07 MED ORDER — MIDAZOLAM HCL 2 MG/2ML IJ SOLN
INTRAMUSCULAR | Status: AC
  Filled 2012-09-07: qty 2

## 2012-09-07 MED ORDER — LIDOCAINE HCL (PF) 2 % IJ SOLN
INTRAMUSCULAR | Status: DC | PRN
  Administered 2012-09-07: 75 mg

## 2012-09-07 MED ORDER — HYDROMORPHONE 0.3 MG/ML IV SOLN
INTRAVENOUS | Status: AC
  Filled 2012-09-07: qty 25

## 2012-09-07 MED ORDER — FENTANYL CITRATE 0.05 MG/ML IJ SOLN
INTRAMUSCULAR | Status: AC
  Filled 2012-09-07: qty 2

## 2012-09-07 MED ORDER — ONDANSETRON HCL 4 MG PO TABS: 4.0000 mg | ORAL_TABLET | Freq: Four times a day (QID) | ORAL | Status: DC | PRN

## 2012-09-07 MED ORDER — LABETALOL HCL 5 MG/ML IV SOLN
INTRAVENOUS | Status: DC | PRN
  Administered 2012-09-07 (×2): 5 mg via INTRAVENOUS

## 2012-09-07 MED ORDER — ENOXAPARIN SODIUM 40 MG/0.4ML ~~LOC~~ SOLN
40.0000 mg | SUBCUTANEOUS | Status: DC
  Administered 2012-09-08 – 2012-09-12 (×5): 40 mg via SUBCUTANEOUS
  Filled 2012-09-07 (×6): qty 0.4

## 2012-09-07 MED ORDER — MIDAZOLAM HCL 2 MG/2ML IJ SOLN
1.0000 mg | INTRAMUSCULAR | Status: AC | PRN
  Administered 2012-09-07 (×2): 1 mg via INTRAVENOUS

## 2012-09-07 MED ORDER — DIPHENHYDRAMINE HCL 50 MG/ML IJ SOLN: 12.5000 mg | Freq: Four times a day (QID) | INTRAMUSCULAR | Status: DC | PRN

## 2012-09-07 MED ORDER — ACETAMINOPHEN 10 MG/ML IV SOLN
1000.0000 mg | Freq: Once | INTRAVENOUS | Status: AC
  Administered 2012-09-07: 1000 mg via INTRAVENOUS
  Filled 2012-09-07: qty 100

## 2012-09-07 SURGICAL SUPPLY — 85 items
APPLIER CLIP 5 13 M/L LIGAMAX5 (MISCELLANEOUS)
APPLIER CLIP ROT 10 11.4 M/L (STAPLE)
APR CLP MED LRG 11.4X10 (STAPLE)
APR CLP MED LRG 5 ANG JAW (MISCELLANEOUS)
BAG URINE DRAINAGE (UROLOGICAL SUPPLIES) IMPLANT
BAG URO CATCHER STRL LF (DRAPE) IMPLANT
BLADE EXTENDED COATED 6.5IN (ELECTRODE) ×2 IMPLANT
BLADE HEX COATED 2.75 (ELECTRODE) ×2 IMPLANT
BLADE SURG SZ10 CARB STEEL (BLADE) ×2 IMPLANT
CABLE HIGH FREQUENCY MONO STRZ (ELECTRODE) ×1 IMPLANT
CANISTER SUCTION 2500CC (MISCELLANEOUS) ×2 IMPLANT
CATH FOLEY SILVER 30CC 28FR (CATHETERS) IMPLANT
CELLS DAT CNTRL 66122 CELL SVR (MISCELLANEOUS) ×1 IMPLANT
CHLORAPREP W/TINT 26ML (MISCELLANEOUS) ×1 IMPLANT
CLIP APPLIE 5 13 M/L LIGAMAX5 (MISCELLANEOUS) ×1 IMPLANT
CLIP APPLIE ROT 10 11.4 M/L (STAPLE) ×1 IMPLANT
CLOTH BEACON ORANGE TIMEOUT ST (SAFETY) ×2 IMPLANT
COVER MAYO STAND STRL (DRAPES) ×2 IMPLANT
DECANTER SPIKE VIAL GLASS SM (MISCELLANEOUS) ×2 IMPLANT
DRAIN CHANNEL 19F RND (DRAIN) ×1 IMPLANT
DRAPE LAPAROSCOPIC ABDOMINAL (DRAPES) ×2 IMPLANT
DRAPE LG THREE QUARTER DISP (DRAPES) ×1 IMPLANT
DRAPE UTILITY XL STRL (DRAPES) ×2 IMPLANT
DRAPE WARM FLUID 44X44 (DRAPE) ×3 IMPLANT
DRSG OPSITE POSTOP 4X8 (GAUZE/BANDAGES/DRESSINGS) ×1 IMPLANT
DRSG TEGADERM 2-3/8X2-3/4 SM (GAUZE/BANDAGES/DRESSINGS) ×4 IMPLANT
ELECT REM PT RETURN 9FT ADLT (ELECTROSURGICAL) ×2
ELECTRODE REM PT RTRN 9FT ADLT (ELECTROSURGICAL) ×1 IMPLANT
FILTER SMOKE EVAC LAPAROSHD (FILTER) IMPLANT
GLOVE BIO SURGEON STRL SZ 6 (GLOVE) ×2 IMPLANT
GLOVE BIOGEL PI IND STRL 6.5 (GLOVE) IMPLANT
GLOVE BIOGEL PI IND STRL 7.0 (GLOVE) ×1 IMPLANT
GLOVE BIOGEL PI INDICATOR 6.5 (GLOVE) ×2
GLOVE BIOGEL PI INDICATOR 7.0 (GLOVE) ×1
GLOVE ECLIPSE 8.0 STRL XLNG CF (GLOVE) ×2 IMPLANT
GLOVE INDICATOR 8.0 STRL GRN (GLOVE) ×1 IMPLANT
GLOVE SURG SIGNA 7.5 PF LTX (GLOVE) ×2 IMPLANT
GOWN PREVENTION PLUS XXLARGE (GOWN DISPOSABLE) ×2 IMPLANT
GOWN STRL NON-REIN LRG LVL3 (GOWN DISPOSABLE) ×1 IMPLANT
GOWN STRL REIN XL XLG (GOWN DISPOSABLE) ×10 IMPLANT
KIT BASIN OR (CUSTOM PROCEDURE TRAY) ×2 IMPLANT
LEGGING LITHOTOMY PAIR STRL (DRAPES) IMPLANT
LIGASURE IMPACT 36 18CM CVD LR (INSTRUMENTS) ×1 IMPLANT
NS IRRIG 1000ML POUR BTL (IV SOLUTION) ×3 IMPLANT
PENCIL BUTTON HOLSTER BLD 10FT (ELECTRODE) ×3 IMPLANT
PUMP PAIN ON-Q (MISCELLANEOUS) ×2 IMPLANT
RELOAD PROXIMATE 75MM BLUE (ENDOMECHANICALS) ×4 IMPLANT
RELOAD STAPLE 75 3.8 BLU REG (ENDOMECHANICALS) IMPLANT
RETRACTOR WND ALEXIS 18 MED (MISCELLANEOUS) IMPLANT
RTRCTR WOUND ALEXIS 18CM MED (MISCELLANEOUS) ×2
SCALPEL HARMONIC ACE (MISCELLANEOUS) ×1 IMPLANT
SCISSORS LAP 5X35 DISP (ENDOMECHANICALS) ×2 IMPLANT
SET IRRIG TUBING LAPAROSCOPIC (IRRIGATION / IRRIGATOR) ×2 IMPLANT
SOLUTION ANTI FOG 6CC (MISCELLANEOUS) ×2 IMPLANT
SPONGE GAUZE 4X4 12PLY (GAUZE/BANDAGES/DRESSINGS) ×1 IMPLANT
SPONGE LAP 18X18 X RAY DECT (DISPOSABLE) ×2 IMPLANT
STAPLER GUN LINEAR PROX 60 (STAPLE) ×1 IMPLANT
STAPLER PROXIMATE 75MM BLUE (STAPLE) ×1 IMPLANT
STAPLER VISISTAT 35W (STAPLE) ×2 IMPLANT
SUCTION POOLE TIP (SUCTIONS) ×2 IMPLANT
SUT PDS AB 1 CTX 36 (SUTURE) ×2 IMPLANT
SUT PDS AB 1 TP1 96 (SUTURE) ×2 IMPLANT
SUT PROLENE 2 0 KS (SUTURE) IMPLANT
SUT SILK 2 0 (SUTURE) ×2
SUT SILK 2 0 SH CR/8 (SUTURE) ×2 IMPLANT
SUT SILK 2-0 18XBRD TIE 12 (SUTURE) ×1 IMPLANT
SUT SILK 3 0 (SUTURE) ×2
SUT SILK 3 0 SH CR/8 (SUTURE) ×2 IMPLANT
SUT SILK 3-0 18XBRD TIE 12 (SUTURE) ×1 IMPLANT
SUT VIC AB 2-0 SH 18 (SUTURE) ×2 IMPLANT
SUT VICRYL 2 0 18  UND BR (SUTURE)
SUT VICRYL 2 0 18 UND BR (SUTURE) ×2 IMPLANT
SYR 30ML LL (SYRINGE) IMPLANT
SYRINGE IRR TOOMEY STRL 70CC (SYRINGE) IMPLANT
SYS LAPSCP GELPORT 120MM (MISCELLANEOUS)
SYSTEM LAPSCP GELPORT 120MM (MISCELLANEOUS) IMPLANT
TOWEL OR 17X26 10 PK STRL BLUE (TOWEL DISPOSABLE) ×4 IMPLANT
TRAY FOLEY CATH 14FRSI W/METER (CATHETERS) ×2 IMPLANT
TRAY LAP CHOLE (CUSTOM PROCEDURE TRAY) ×2 IMPLANT
TROCAR BLADELESS OPT 5 100 (ENDOMECHANICALS) ×7 IMPLANT
TROCAR XCEL NON-BLD 11X100MML (ENDOMECHANICALS) ×2 IMPLANT
TROCAR XCEL UNIV SLVE 11M 100M (ENDOMECHANICALS) IMPLANT
TUBING FILTER THERMOFLATOR (ELECTROSURGICAL) ×2 IMPLANT
YANKAUER SUCT BULB TIP 10FT TU (MISCELLANEOUS) ×3 IMPLANT
YANKAUER SUCT BULB TIP NO VENT (SUCTIONS) ×1 IMPLANT

## 2012-09-07 NOTE — Interval H&P Note (Signed)
History and Physical Interval Note:  09/07/2012 11:34 AM  Lauren Norris  has presented today for surgery, with the diagnosis of transverse colon  mass  The goals and the various methods of treatment have been discussed with the patient and family. After consideration of risks, benefits and other options for treatment, the patient has consented to  Procedure(s): LAPAROSCOPIC ASSISTED TRANSVERSE COLECTOMY (N/A), possible open as a surgical intervention .  The patient's history has been reviewed, patient examined today, no change in status, stable for surgery.  I have reviewed the patient's chart and labs.  Questions were answered to the patient's satisfaction.     Ernestene Mention

## 2012-09-07 NOTE — Op Note (Signed)
Patient Name:           Lauren Norris   Date of Surgery:        09/07/2012  Pre op Diagnosis:      Neoplasm of transverse colon  Post op Diagnosis:    same  Procedure:                 Laparoscopic assisted extended right hemicolectomy, enterorrhaphy times one  Surgeon:                     Angelia Mould. Derrell Lolling, M.D., FACS  Assistant:                     Almond Lint, M.D., FACS  Operative Indications:   Lauren Norris is a 47 y.o. female. She is referred by Dr. Charlott Norris for evaluation and surgical management of a neoplastic mass of the transverse colon. Dr. Richarda Norris is her gynecologist and PCP. I have operated on her father, Mr. Lauren Norris, for colon cancer. He is currently doing well following chemotherapy.  This patient does not have any GI symptoms. Specifically no bowel pain cramps nausea vomiting change in bowel habit or blood in her stool. Has occasional heartburn.  Her father had colon cancer at age 40. A paternal uncle was operated on for colon cancer and is currently doing well. Paternal grandmother died of metastatic colon cancer. She was referred for her first screening colonoscopy recently. Dr. Bosie Clos performed colonoscopy to the cecum and describes a polypoid, infiltrated, nonobstructing, large mass in the transverse colon. This was partially circumferential, involving one half of the lumen circumference. This area was tattooed biopsies were taken. 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 This was a solitary mucosal abnormality. CT scan shows a small mass of the transverse colon just to the right of the midline. There is no sign of any other disease process within the abdomen.CEA is less than 0.5. She is generally healthy although has gained a lot of weight in the past year because she lost her job. Her BMI is 42. She has had a C-section. No other medical problems. She underwent a bowel prep at home and is brought to the operating room  electively   Operative Findings:       There was a multilobulated polypoid mass of the right transverse colon. This was far enough distally that I needed to take the resection over to the left transverse colon and sacrificed the middle colic vessels. This did not appear to have invaded the wall of the colon. There was no adenopathy that I could detect. The liver appeared healthy and without signs of any malignant disease. The omentum was very large, consistent with her BMI  Procedure in Detail:          Following the induction of general endotracheal anesthesia intravenous antibiotics were given and a Foley catheter was placed. The abdomen was prepped and draped in sterile fashion. Surgical time out was performed. 0.5% Marcaine with epinephrine was used as a local infiltration anesthetic. A 5 mm optical trocar was placed in the left subcostal region without difficulty and a pneumoperitoneum was created. An 11 mm trocar was placed in the midline just below the umbilicus. 2 more 5 mm trocars were placed on the left side and a 5 mm trocar placed on the right side. We lifted up the omentum and could see the tattooed area in the mid transverse colon. We extensively dissected the omentum  off of the colon, leaving the omentum intact. We took the dissection 6 inches to 8 inches distal to the tattooed area. We mobilized the hepatic flexure by dividing the omental and retroperitoneal  attachments. We dissected the hepatic flexure down off of Gerota's fascia and away from the gallbladder. We mobilized the ascending colon and terminal ileum medially by dividing the lateral peritoneal attachments and mobilizing them to the midline. At this point we felt we had everything completely mobilized. The umbilical trocar was removed. An incision approximately 8 or 9 cm. was made above and below the umbilicus. The fascia was incised in the midline. Self-retaining retractor was placed. I was able to deliver the terminal ileum, right  colon and  transverse colon into the wound. I could palpate a small polyp in the tattooed area. I transected the terminal ileum with a GIA stapling device. I transected the left transverse colon with a GIA stapling device at least 6 inches distal to the tattooed area and the palpable mass. Mesenteric vessels were isolated, skeletonized and either divided with the LigaSure device or clamped divided and suture ligated with 2-0 silk ties if they were larger vessels. The specimen was removed and taken to the back table and opened up. We felt we had identified the pathology and resected it with a good margin. We changed our gowns and gloves.  Anastomosis was created between the terminal ileum and the left transverse colon with the GIA stapling device. The colon and the small bowel looked pink and healthy. The common defect was closed with a TA 60 stapling device.  During the course of dissection a partial-thickness enterotomy occurred in the mid small bowel which was recognized and closed with 3 interrupted sutures of 3-0 silk. This was inspected at the completion of the case and it  looked fine.  We thoroughly irrigated the abdomen and pelvis and there did not appear to be much blood. The small bowel and colon and omentum were returned to their anatomic positions. The midline fascia was closed with a running suture of #1, double-stranded PDS. Skin was closed with skin staples with Telfa wicks in between. We reestablished the pneumoperitoneum and looked around. We evacuated what was left of the irrigation fluid and there was no signs of any bleeding or other problem. We released the pneumoperitoneum and removed the trocars. Trocar sites were also closed with staples. Bandages were placed. The patient was taken to recovery in stable condition. EBL 50-75 cc. Counts correct. Complications none.     Angelia Mould. Derrell Lolling, M.D., FACS General and Minimally Invasive Surgery Breast and Colorectal  Surgery  09/07/2012 3:26 PM

## 2012-09-07 NOTE — Anesthesia Preprocedure Evaluation (Signed)
Anesthesia Evaluation  Patient identified by MRN, date of birth, ID band Patient awake    Reviewed: Allergy & Precautions, H&P , NPO status , Patient's Chart, lab work & pertinent test results  Airway Mallampati: II TM Distance: >3 FB Neck ROM: Full    Dental no notable dental hx.    Pulmonary neg pulmonary ROS,  breath sounds clear to auscultation  Pulmonary exam normal       Cardiovascular negative cardio ROS  Rhythm:Regular Rate:Normal     Neuro/Psych negative neurological ROS  negative psych ROS   GI/Hepatic negative GI ROS, Neg liver ROS,   Endo/Other  Morbid obesity  Renal/GU negative Renal ROS  negative genitourinary   Musculoskeletal negative musculoskeletal ROS (+)   Abdominal   Peds negative pediatric ROS (+)  Hematology negative hematology ROS (+)   Anesthesia Other Findings   Reproductive/Obstetrics negative OB ROS                           Anesthesia Physical Anesthesia Plan  ASA: II  Anesthesia Plan: General   Post-op Pain Management:    Induction: Intravenous  Airway Management Planned: Oral ETT  Additional Equipment:   Intra-op Plan:   Post-operative Plan: Extubation in OR  Informed Consent: I have reviewed the patients History and Physical, chart, labs and discussed the procedure including the risks, benefits and alternatives for the proposed anesthesia with the patient or authorized representative who has indicated his/her understanding and acceptance.   Dental advisory given  Plan Discussed with: CRNA  Anesthesia Plan Comments:         Anesthesia Quick Evaluation  

## 2012-09-07 NOTE — Transfer of Care (Signed)
Immediate Anesthesia Transfer of Care Note  Patient: Lauren Norris  Procedure(s) Performed: Procedure(s): LAPAROSCOPIC ASSISTED TRANSVERSE COLECTOMY (N/A)  Patient Location: PACU  Anesthesia Type:General  Level of Consciousness: alert , sedated and patient cooperative  Airway & Oxygen Therapy: Patient Spontanous Breathing  Post-op Assessment: Report given to PACU RN and Post -op Vital signs reviewed and stable  Post vital signs: Reviewed and stable  Complications: No apparent anesthesia complications

## 2012-09-08 ENCOUNTER — Encounter (HOSPITAL_COMMUNITY): Payer: Self-pay | Admitting: General Surgery

## 2012-09-08 LAB — BASIC METABOLIC PANEL
BUN: 7 mg/dL (ref 6–23)
Chloride: 99 mEq/L (ref 96–112)
GFR calc Af Amer: >90 mL/min (ref >90)
GFR calc non Af Amer: >90 mL/min (ref >90)
Potassium: 4.1 mEq/L (ref 3.5–5.1)
Sodium: 133 mEq/L — ABNORMAL LOW (ref 135–145)

## 2012-09-08 LAB — CBC
HCT: 38.2 % (ref 36.0–46.0)
Hemoglobin: 12.6 g/dL (ref 12.0–15.0)
MCH: 28.1 pg (ref 26.0–34.0)
MCHC: 33 g/dL (ref 30.0–36.0)
MCV: 85.1 fL (ref 78.0–100.0)
Platelets: 276 10*3/uL (ref 150–400)
RBC: 4.49 MIL/uL (ref 3.87–5.11)
RDW: 13 % (ref 11.5–15.5)
WBC: 12.6 10*3/uL — ABNORMAL HIGH (ref 4.0–10.5)

## 2012-09-08 NOTE — Progress Notes (Signed)
1 Day Post-Op  Subjective: Stable and alert.denies nausea. Denies shortness of breath. Incisional pain moderate, but controlled well with PCA. Once to get out of bed. Eating lots of ice chips. I discussed the operative findings with her.  Hemoglobin 12.6. WBC 12,600. Potassium 4.1. BUN 7. Glucose 153.  Objective: Vital signs in last 24 hours: Temp:  [97.5 F (36.4 C)-98.9 F (37.2 C)] 98.4 F (36.9 C) (08/21 0513) Pulse Rate:  [75-107] 102 (08/21 0513) Resp:  [10-24] 18 (08/21 0513) BP: (131-160)/(73-99) 145/94 mmHg (08/21 0513) SpO2:  [94 %-100 %] 97 % (08/21 0513) Weight:  [239 lb (108.41 kg)] 239 lb (108.41 kg) (08/20 1848) Last BM Date: 09/06/12  Intake/Output from previous day: 08/20 0701 - 08/21 0700 In: 3739.6 [I.V.:3639.6; IV Piggyback:100] Out: 4350 [Urine:4250; Blood:100] Intake/Output this shift: Total I/O In: 639.6 [I.V.:639.6] Out: 3650 [Urine:3650]  General appearance: alert. Cooperative. Mental status normal. Minimal distress. Resp: clear to auscultation bilaterally GI: abdomen is soft and nondistended. Wounds are clean. Appropriate incisional tenderness.  Lab Results:  Results for orders placed during the hospital encounter of 09/07/12 (from the past 24 hour(s))  BASIC METABOLIC PANEL     Status: Abnormal   Collection Time    09/08/12  4:13 AM      Result Value Range   Sodium 133 (*) 135 - 145 mEq/L   Potassium 4.1  3.5 - 5.1 mEq/L   Chloride 99  96 - 112 mEq/L   CO2 26  19 - 32 mEq/L   Glucose, Bld 153 (*) 70 - 99 mg/dL   BUN 7  6 - 23 mg/dL   Creatinine, Ser 1.61  0.50 - 1.10 mg/dL   Calcium 8.6  8.4 - 09.6 mg/dL   GFR calc non Af Amer >90  >90 mL/min   GFR calc Af Amer >90  >90 mL/min  CBC     Status: Abnormal   Collection Time    09/08/12  4:13 AM      Result Value Range   WBC 12.6 (*) 4.0 - 10.5 K/uL   RBC 4.49  3.87 - 5.11 MIL/uL   Hemoglobin 12.6  12.0 - 15.0 g/dL   HCT 04.5  40.9 - 81.1 %   MCV 85.1  78.0 - 100.0 fL   MCH 28.1  26.0 -  34.0 pg   MCHC 33.0  30.0 - 36.0 g/dL   RDW 91.4  78.2 - 95.6 %   Platelets 276  150 - 400 K/uL     Studies/Results: @RISRSLT24 @  . alvimopan  12 mg Oral BID  . enoxaparin (LOVENOX) injection  40 mg Subcutaneous Q24H  . [START ON 09/12/2012] estradiol  0.05 mg Transdermal Weekly  . HYDROmorphone PCA 0.3 mg/mL   Intravenous Q4H     Assessment/Plan: s/p Procedure(s): LAPAROSCOPIC ASSISTED TRANSVERSE COLECTOMY  POD #1.  Laparoscopic-assisted extended right colectomy. Stable DC Foley Clear liquid diet entereg protocol Polyuria, decrease IV rate Ambulate  Check pathology  DVT prophylaxis. On Lovenox  @PROBHOSP @  LOS: 1 day    Molli Gethers M. Derrell Lolling, M.D., [facility] General and Minimally invasive Surgery Breast and Colorectal Surgery Office:   [phone] Pager:   [phone]  09/08/2012  . .prob

## 2012-09-08 NOTE — Anesthesia Postprocedure Evaluation (Signed)
  Anesthesia Post-op Note  Patient: Lauren Norris  Procedure(s) Performed: Procedure(s) (LRB): LAPAROSCOPIC ASSISTED TRANSVERSE COLECTOMY (N/A)  Patient Location: PACU  Anesthesia Type: General  Level of Consciousness: awake and alert   Airway and Oxygen Therapy: Patient Spontanous Breathing  Post-op Pain: mild  Post-op Assessment: Post-op Vital signs reviewed, Patient's Cardiovascular Status Stable, Respiratory Function Stable, Patent Airway and No signs of Nausea or vomiting  Last Vitals:  Filed Vitals:   09/08/12 0731  BP:   Pulse:   Temp:   Resp: 16    Post-op Vital Signs: stable   Complications: No apparent anesthesia complications

## 2012-09-08 NOTE — Care Management Note (Signed)
    Page 1 of 1   09/08/2012     11:01:52 AM   CARE MANAGEMENT NOTE 09/08/2012  Patient:  Lauren Norris   Account Number:  [id number]  Date Initiated:  09/08/2012  Documentation initiated by:  Lorenda Ishihara  Subjective/Objective Assessment:   47 yo female admitted s/p lap colectomy. PTA lived at home with spouse.     Action/Plan:   Home when stable   Anticipated DC Date:  09/12/2012   Anticipated DC Plan:  HOME/SELF CARE      DC Planning Services  CM consult      Choice offered to / List presented to:             Status of service:  Completed, signed off Medicare Important Message given?   (If response is "NO", the following Medicare IM given date fields will be blank) Date Medicare IM given:   Date Additional Medicare IM given:    Discharge Disposition:  HOME/SELF CARE  Per UR Regulation:  Reviewed for med. necessity/level of care/duration of stay  If discussed at Long Length of Stay Meetings, dates discussed:    Comments:

## 2012-09-09 MED ORDER — MORPHINE SULFATE 2 MG/ML IJ SOLN
1.0000 mg | INTRAMUSCULAR | Status: DC | PRN
  Administered 2012-09-09 – 2012-09-11 (×9): 2 mg via INTRAVENOUS
  Filled 2012-09-09 (×9): qty 1

## 2012-09-09 MED ORDER — OXYCODONE-ACETAMINOPHEN 5-325 MG PO TABS
1.0000 | ORAL_TABLET | ORAL | Status: DC | PRN
  Administered 2012-09-09 – 2012-09-12 (×11): 2 via ORAL
  Filled 2012-09-09 (×11): qty 2

## 2012-09-09 NOTE — Progress Notes (Signed)
2 Days Post-Op  Subjective: Stable and alert. Afebrile, VSS. mbulating in halls. No flatus or stool, but tolerating clear liquids. Voiding well. Good pain control.  Pathology pending.  Objective: Vital signs in last 24 hours: Temp:  [98.2 F (36.8 C)-98.7 F (37.1 C)] 98.2 F (36.8 C) (08/22 0542) Pulse Rate:  [78-101] 101 (08/22 0542) Resp:  [16-22] 20 (08/22 0542) BP: (125-153)/(79-93) 153/93 mmHg (08/22 0542) SpO2:  [94 %-100 %] 94 % (08/22 0542) Last BM Date: 09/06/12  Intake/Output from previous day: 08/21 0701 - 08/22 0700 In: 1839.2 [I.V.:1839.2] Out: 1950 [Urine:1950] Intake/Output this shift: Total I/O In: 885 [I.V.:885] Out: 1350 [Urine:1350]  General appearance: alert. Pleasant. Cooperative. In no distress. Sitting up in chair. Resp: clear to auscultation bilaterally GI: abdomen obese. Soft. Nondistended. Wounds looked good. Rare bowel sounds.  Lab Results:  No results found for this or any previous visit (from the past 24 hour(s)).   Studies/Results: @RISRSLT24 @  . alvimopan  12 mg Oral BID  . enoxaparin (LOVENOX) injection  40 mg Subcutaneous Q24H  . [START ON 09/12/2012] estradiol  0.05 mg Transdermal Weekly     Assessment/Plan: s/p Procedure(s): LAPAROSCOPIC ASSISTED TRANSVERSE COLECTOMY  POD #2. Laparoscopic-assisted extended right colectomy.  Stable   Full liquid diet. Will not advance further until ileus clearly resolving. entereg protocol   Ambulate  DC PCA. Allow IV morphine or Percocet, as desired. Check pathology   DVT prophylaxis. On Lovenox   @PROBHOSP @  LOS: 2 days    Jelisha Weed M. Derrell Lolling, M.D., [facility] General and Minimally invasive Surgery Breast and Colorectal Surgery Office:   [phone] Pager:   [phone]  09/09/2012  . .prob

## 2012-09-10 LAB — CBC
HCT: 37.4 % (ref 36.0–46.0)
Hemoglobin: 12.2 g/dL (ref 12.0–15.0)
MCV: 87.2 fL (ref 78.0–100.0)
RBC: 4.29 MIL/uL (ref 3.87–5.11)
RDW: 13.2 % (ref 11.5–15.5)
WBC: 8 10*3/uL (ref 4.0–10.5)

## 2012-09-10 LAB — BASIC METABOLIC PANEL
BUN: 5 mg/dL — ABNORMAL LOW (ref 6–23)
CO2: 30 mEq/L (ref 19–32)
Chloride: 104 mEq/L (ref 96–112)
Creatinine, Ser: 0.73 mg/dL (ref 0.50–1.10)
GFR calc Af Amer: >90 mL/min (ref >90)
Potassium: 3.8 mEq/L (ref 3.5–5.1)

## 2012-09-10 NOTE — Progress Notes (Signed)
Patient ID: Lauren Norris, female   DOB: 06/08/1965, 47 y.o.   MRN: [id number] 3 Days Post-Op  Subjective: Some increased soreness with ambulation. Foley out and voiding. Tolerating full liquids without nausea but no flatus or bowel movement yet.  Objective: Vital signs in last 24 hours: Temp:  [98.3 F (36.8 C)-98.5 F (36.9 C)] 98.5 F (36.9 C) (08/23 0500) Pulse Rate:  [79-103] 87 (08/23 0500) Resp:  [18] 18 (08/23 0500) BP: (127-160)/(79-99) 127/87 mmHg (08/23 0500) SpO2:  [96 %-100 %] 96 % (08/23 0500) Last BM Date: 09/06/12  Intake/Output from previous day: 08/22 0701 - 08/23 0700 In: 2007.5 [P.O.:240; I.V.:1767.5] Out: 2450 [Urine:2450] Intake/Output this shift:    General appearance: alert, cooperative and no distress GI: normal findings: soft, non-tender Incision/Wound: dressing dry with some slight bloody drainage  Lab Results:   Recent Labs  09/08/12 0413 09/10/12 0435  WBC 12.6* 8.0  HGB 12.6 12.2  HCT 38.2 37.4  PLT 276 243   BMET  Recent Labs  09/08/12 0413 09/10/12 0435  NA 133* 140  K 4.1 3.8  CL 99 104  CO2 26 30  GLUCOSE 153* 119*  BUN 7 5*  CREATININE 0.55 0.73  CALCIUM 8.6 9.1     Studies/Results: No results found.  Anti-infectives: Anti-infectives   Start     Dose/Rate Route Frequency Ordered Stop   09/07/12 2200  cefoTEtan (CEFOTAN) 2 g in dextrose 5 % 50 mL IVPB     2 g 100 mL/hr over 30 Minutes Intravenous Every 12 hours 09/07/12 1824 09/07/12 2321   09/07/12 0600  cefoTEtan (CEFOTAN) 2 g in dextrose 5 % 50 mL IVPB     2 g 100 mL/hr over 30 Minutes Intravenous On call to O.R. 09/06/12 1931 09/07/12 1248      Assessment/Plan: s/p Procedure(s): LAPAROSCOPIC ASSISTED TRANSVERSE COLECTOMY Doing well postoperatively. No bowel function yet. Ambulation encouraged.   LOS: 3 days    Lauren Norris T 09/10/2012

## 2012-09-11 MED ORDER — METOPROLOL TARTRATE 25 MG PO TABS: 25.0000 mg | ORAL_TABLET | Freq: Two times a day (BID) | ORAL | Status: DC | PRN

## 2012-09-11 MED ORDER — METOPROLOL TARTRATE 25 MG PO TABS: 25.0000 mg | ORAL_TABLET | Freq: Two times a day (BID) | ORAL | Status: DC

## 2012-09-11 MED ORDER — METOPROLOL TARTRATE 25 MG PO TABS
25.0000 mg | ORAL_TABLET | Freq: Two times a day (BID) | ORAL | Status: DC | PRN
  Filled 2012-09-11: qty 1

## 2012-09-11 NOTE — Progress Notes (Addendum)
Patient ID: Lauren Norris, female   DOB: November 17, 1965, 47 y.o.   MRN: [id number] 4 Days Post-Op  Subjective: No complaints today except some occasional incisional pain when coughing. Has had a bowel movement.  Objective: Vital signs in last 24 hours: Temp:  [97.4 F (36.3 C)-98.4 F (36.9 C)] 97.4 F (36.3 C) (08/24 0607) Pulse Rate:  [82-88] 85 (08/24 0607) Resp:  [18] 18 (08/24 0607) BP: (124-133)/(85-97) 133/97 mmHg (08/24 0607) SpO2:  [98 %-100 %] 100 % (08/24 0607) Last BM Date: 09/10/12  Intake/Output from previous day: 08/23 0701 - 08/24 0700 In: 2207.5 [P.O.:360; I.V.:1847.5] Out: 4000 [Urine:4000] Intake/Output this shift:    General appearance: alert, cooperative, no distress and morbidly obese GI: normal findings: soft, non-tender Incision/Wound: dressing with old bloody drainage, no erythema  Lab Results:   Recent Labs  09/10/12 0435  WBC 8.0  HGB 12.2  HCT 37.4  PLT 243   BMET  Recent Labs  09/10/12 0435  NA 140  K 3.8  CL 104  CO2 30  GLUCOSE 119*  BUN 5*  CREATININE 0.73  CALCIUM 9.1     Studies/Results: No results found.  Anti-infectives: Anti-infectives   Start     Dose/Rate Route Frequency Ordered Stop   09/07/12 2200  cefoTEtan (CEFOTAN) 2 g in dextrose 5 % 50 mL IVPB     2 g 100 mL/hr over 30 Minutes Intravenous Every 12 hours 09/07/12 1824 09/07/12 2321   09/07/12 0600  cefoTEtan (CEFOTAN) 2 g in dextrose 5 % 50 mL IVPB     2 g 100 mL/hr over 30 Minutes Intravenous On call to O.R. 09/06/12 1931 09/07/12 1248      Assessment/Plan: s/p Procedure(s): LAPAROSCOPIC ASSISTED TRANSVERSE COLECTOMY Doing well. Advanced regular diet. She is anticipating discharge tomorrow. Hypertension. Will add when necessary Lopressor and discussed with her that she will need long-term management of her hypertension.   LOS: 4 days    Kamila Broda T 09/11/2012

## 2012-09-11 NOTE — Progress Notes (Signed)
Pharmacy Brief Note - Alvimopan (Entereg)  The standing order set for alvimopan (Entereg) now includes an automatic order to discontinue the drug after the patient has had a bowel movement.  The change was approved by the Pharmacy & Therapeutics Committee and the Medical Executive Committee.    This patient has had a bowel movement documented by physician and nursing staff.  Therefore, alvimopan has been discontinued.  If there are questions, please contact the pharmacy at [phone].  Thank you  Lynann Beaver PharmD, BCPS Pager [phone] 09/11/2012 10:35 AM

## 2012-09-12 ENCOUNTER — Telehealth: Payer: Self-pay | Admitting: Oncology

## 2012-09-12 ENCOUNTER — Telehealth: Payer: Self-pay

## 2012-09-12 ENCOUNTER — Other Ambulatory Visit (INDEPENDENT_AMBULATORY_CARE_PROVIDER_SITE_OTHER): Payer: Self-pay | Admitting: General Surgery

## 2012-09-12 ENCOUNTER — Telehealth (INDEPENDENT_AMBULATORY_CARE_PROVIDER_SITE_OTHER): Payer: Self-pay | Admitting: General Surgery

## 2012-09-12 DIAGNOSIS — C189 Malignant neoplasm of colon, unspecified: Secondary | ICD-10-CM

## 2012-09-12 MED ORDER — OXYCODONE-ACETAMINOPHEN 7.5-325 MG PO TABS: 1.0000 | ORAL_TABLET | ORAL | Status: DC | PRN

## 2012-09-12 NOTE — Telephone Encounter (Signed)
C/D 09/12/12 for appt. 10/03/12

## 2012-09-12 NOTE — Discharge Summary (Signed)
Patient ID: Lauren Norris [id number] 47 y.o. [birth year]  Admit date: 09/07/2012  Discharge date and time: 09/12/2012  Admitting Physician: Ernestene Mention  Discharge Physician: Ernestene Mention  Admission Diagnoses: transverse colon  mass  Discharge Diagnoses: adenocarcinoma of the transverse colon, stage TI, N0, stage I Obesity Transient hypertension  Operations: Procedure(s): LAPAROSCOPIC ASSISTED EXTENDED RIGHT COLECTOMY  Admission Condition: good  Discharged Condition: good  Indication for Admission: Neosha Switalski is a 47 y.o. female. She is referred by Dr. Charlott Rakes for evaluation and surgical management of a neoplastic mass of the transverse colon. Dr. Richarda Overlie is her gynecologist and PCP. I have operated on her father, Mr. [name], for colon cancer. He is currently doing well following chemotherapy.  This patient does not have any GI symptoms. Specifically no bowel pain,  Cramps, nausea,  Vomiting or change in bowel habit or blood in her stool. Has occasional heartburn.  Her father had stage III colon cancer at age 62. A paternal uncle was operated on for colon cancer and is currently doing well. Paternal grandmother died of metastatic colon cancer. She was referred for her first screening colonoscopy recently. Dr. Bosie Clos performed colonoscopy to the cecum and describes a polypoid, infiltrated, nonobstructing, large mass in the transverse colon. This was partially circumferential, involving one half of the lumen circumference. This area was tattooed biopsies were taken. 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 This was a solitary mucosal abnormality. CT scan shows a small mass in the transverse colon, somewhat to the right of the midline. There is no sign of any adenopathy or metastatic disease. CEA is less than 0.5. She is generally healthy although has gained a lot of weight in the past year because she lost her job. Her BMI is 42.  She has had a C-section. No other medical problems. She is married and has one 28 year old daughter. She underwent a bowel prep at home and is operated upon electively.    Hospital Course: on the day of admission the patient was taken to the operating room and underwent a laparoscopic-assisted extended right hemicolectomy. The small tumor mass was palpable in the transverse colon in the area of the tattoo. This was to the right of the midline but was well beyond the hepatic flexure. The ascending colon and hepatic flexure were densely adherent to each other from chronic adhesions. I did an extended right hemicolectomy. Anastomosis was created between the terminal ileum and the left transverse colon with a stapling technique.  Final pathology report shows adenocarcinoma arising in a villous adenoma. Lymph nodes are negative. Stage TI, N0, stage I. The pathology report was discussed with the patient I gave her a copy. She was advised to have medical oncology consultation but she requests as well.  She progressed reasonably well. Slowly but uneventfully progressed in diet and activities. Foley catheter was removed. She had no problems voiding. Diet was slowly advanced until she had a bowel movement. Although they're discharge she was tolerating regular diet and had had several loose bowel movements. She was ambulatory. Her only complaint is incisional pain. On the day of discharge all the wounds were examined. There was no evidence of infection. The wicks were removed the midline wound. Staples were left in place and all the wounds were redressed. She was given instructions in diet and activities. She is given a prescription for Percocet. She was advised to my office will arrange referral to medical oncology and that she also should consider genetic counseling and genetic  testing.  She was advised to return to see me in the office in approximately one week for wound check and staple removal.  Consults:  None  Significant Diagnostic Studies: surgical pathology  Treatments: surgery: laparoscopic-assisted extended right hemicolectomy  Disposition: Home  Patient Instructions:    Medication List    ASK your doctor about these medications       alprazolam 2 MG tablet  Commonly known as:  XANAX  Take 2 mg by mouth at bedtime as needed for sleep.     estradiol 0.0375 MG/24HR  Commonly known as:  VIVELLE-DOT  Place 1 patch onto the skin 2 (two) times a week.     estradiol 0.05 MG/24HR patch  Commonly known as:  VIVELLE-DOT  Place 1 patch onto the skin. APPLY ONE PATCH TO THE SKIN TWICE A WEEK ON Monday AND THURSDAY     GOODY HEADACHE PO  Take 1 packet by mouth every 6 (six) hours as needed (headache).     ibuprofen 200 MG tablet  Commonly known as:  ADVIL,MOTRIN  Take 400 mg by mouth every 6 (six) hours as needed for pain.        Activity: no sports or heavy lifting for 4-6 weeks. Ambulate frequently. Okay to shower. No tub baths. Diet: low fat, low cholesterol diet Wound Care: reinforce dressing PRN  Follow-up:  With Dr. Derrell Lolling in 1 week.  Signed: Angelia Mould. Derrell Lolling, M.D., FACS General and minimally invasive surgery Breast and Colorectal Surgery  09/12/2012, 6:19 AM

## 2012-09-12 NOTE — Telephone Encounter (Signed)
PT SCHEDULED 09/15 @ 1:30 W/DR. SHERRILL PER GINA D.

## 2012-09-12 NOTE — Telephone Encounter (Signed)
Called patient husband to let him know that his wife has an apt to come in to see Dr Derrell Lolling on 09-16-12 @ 4:00 for staple removal and reck wound. And he is also aware that two referral will be sen to the [facility] to Dr Truett Perna and genetic testing and they will give them a call.

## 2012-09-14 ENCOUNTER — Telehealth: Payer: Self-pay | Admitting: Genetic Counselor

## 2012-09-14 NOTE — Telephone Encounter (Signed)
LVOM FOR PT TO RETURN CALL IN RE TO GENETIC REFERRAL °

## 2012-09-15 ENCOUNTER — Telehealth: Payer: Self-pay | Admitting: *Deleted

## 2012-09-15 ENCOUNTER — Telehealth: Payer: Self-pay | Admitting: Genetic Counselor

## 2012-09-15 NOTE — Telephone Encounter (Signed)
Attempted to reach patient without success.  Will continue to try to reach.

## 2012-09-15 NOTE — Telephone Encounter (Signed)
S/W PT IN RE TO GENETIC APPT 11/03 @ 10 W/KAREN POWELL WELCOME PACKET MAILED.

## 2012-09-16 ENCOUNTER — Ambulatory Visit (INDEPENDENT_AMBULATORY_CARE_PROVIDER_SITE_OTHER): Payer: Medicaid Other | Admitting: General Surgery

## 2012-09-16 ENCOUNTER — Telehealth (INDEPENDENT_AMBULATORY_CARE_PROVIDER_SITE_OTHER): Payer: Self-pay | Admitting: *Deleted

## 2012-09-16 ENCOUNTER — Telehealth: Payer: Self-pay | Admitting: *Deleted

## 2012-09-16 ENCOUNTER — Other Ambulatory Visit (INDEPENDENT_AMBULATORY_CARE_PROVIDER_SITE_OTHER): Payer: Self-pay | Admitting: *Deleted

## 2012-09-16 ENCOUNTER — Encounter (INDEPENDENT_AMBULATORY_CARE_PROVIDER_SITE_OTHER): Payer: Self-pay | Admitting: General Surgery

## 2012-09-16 VITALS — BP 122/78 | HR 72 | Resp 16 | Ht 62.0 in | Wt 237.0 lb

## 2012-09-16 DIAGNOSIS — C184 Malignant neoplasm of transverse colon: Secondary | ICD-10-CM

## 2012-09-16 MED ORDER — HYDROCODONE-ACETAMINOPHEN 5-325 MG PO TABS: 1.0000 | ORAL_TABLET | Freq: Four times a day (QID) | ORAL | Status: DC | PRN

## 2012-09-16 NOTE — Telephone Encounter (Signed)
Patient called to request a refill of her pain medication.  Norco 5/325mg  1 tablet every 4-6 hours as need for pain #30 no refills called into [facility] in [place] at this time [phone] per protocol.

## 2012-09-16 NOTE — Patient Instructions (Addendum)
You appear to be recovering from your colon resection without any major complications.  The wound is healing normally and the staples were removed today. You may remove the Steri-Strips in 7-10 days if they don't fall off by then  Increase daily walking.  No sports or heavy lifting until October 1.  Drink lots of water and follow a high-fiber, low-fat diet  Return to see Dr. Derrell Lolling in 6 weeks.

## 2012-09-16 NOTE — Progress Notes (Signed)
Patient ID: Lauren Norris, female   DOB: Jun 07, 1965, 47 y.o.   MRN: [id number] History: Lauren Norris underwent extended right colectomy on 09/07/2012 for a neoplastic mass of the mid transverse colon. Final pathology report showed invasive adenocarcinoma arising in a background of tubular adenoma. This invaded the submucosa. 18 lymph nodes negative. Pathologic stage TI, N0, stage I. She is doing reasonably well. She is sore but eating okay and her bowels are moving well. Bowel movements are still fairly loose or soft. No fever. No nausea. No wound problems. She is scheduled to see Dr. Truett Perna in September.  Exam: Patient looks well. Mother is with her. Lungs are clear to auscultation, especially at the bases. Heart regular rate and rhythm. No tachycardia Abdomen obese. Soft. All incisions are healing. All staples removed in the midline with Steri-Strips. Extremities no lower extremity edema or tenderness. Homans sign negative bilaterally  Assessment:  Stage I adenocarcinoma of the mid transverse colon, recovering uneventfully in the early postop period following extended right colectomy Family history colon cancer in the father,paternal GM, paternal uncle  Plan: Diet and activities discussed. Hydration. High fiber diet. Increase activities with ambulation. No sports or heavy lifting until October 1. Appointment with Dr. Truett Perna to discuss oncologic energetic issues in September Return to see me in 6 weeks.   Angelia Mould. Derrell Lolling, M.D., [facility] General and Minimally invasive Surgery Breast and Colorectal Surgery Office:   [phone] Pager:   [phone]

## 2012-09-16 NOTE — Telephone Encounter (Signed)
Spoke with patient by phone and confirmed appointment with Dr. Truett Perna for 10/03/12.  Contact name and numbers were provided.

## 2012-09-20 ENCOUNTER — Telehealth (INDEPENDENT_AMBULATORY_CARE_PROVIDER_SITE_OTHER): Payer: Self-pay

## 2012-09-20 ENCOUNTER — Other Ambulatory Visit (INDEPENDENT_AMBULATORY_CARE_PROVIDER_SITE_OTHER): Payer: Self-pay

## 2012-09-20 DIAGNOSIS — R109 Unspecified abdominal pain: Secondary | ICD-10-CM

## 2012-09-20 MED ORDER — HYDROCODONE-ACETAMINOPHEN 5-325 MG PO TABS: 1.0000 | ORAL_TABLET | Freq: Four times a day (QID) | ORAL | Status: DC | PRN

## 2012-09-20 NOTE — Telephone Encounter (Signed)
Patient is requesting norco refill , denies temp,redness, drainage,odorat incision site ,no constipation and urination is normal .Norco 5/325 one po q4-6 prn pain  # [facility] to [facility] [phone]

## 2012-09-22 ENCOUNTER — Encounter (INDEPENDENT_AMBULATORY_CARE_PROVIDER_SITE_OTHER): Payer: Medicaid Other | Admitting: General Surgery

## 2012-09-27 ENCOUNTER — Telehealth (INDEPENDENT_AMBULATORY_CARE_PROVIDER_SITE_OTHER): Payer: Self-pay

## 2012-09-27 DIAGNOSIS — M549 Dorsalgia, unspecified: Secondary | ICD-10-CM

## 2012-09-27 NOTE — Addendum Note (Signed)
Addended byIvory Broad on: 09/27/2012 01:41 PM   Modules accepted: Orders

## 2012-09-27 NOTE — Telephone Encounter (Signed)
Patient calling into office today concerned with lower & mid back pain sh continues to have since surgery.  Patient reports having daily bowl movements.  Patient denies having any fevers or difficulty with urination.  Patient reports that she's having discomfort when lying on her back.  Patient not sure if she should call her PCP or our office for advice.  Patient s/p Laparoscopic transverse colectomy on 09/07/12.

## 2012-09-27 NOTE — Telephone Encounter (Signed)
Get CBC and urinalysis. If normal then suspect musculoskeletal pain and refer to PCP.

## 2012-09-27 NOTE — Telephone Encounter (Signed)
I called and let the pt know Dr Derrell Lolling wants a CBC and urinalysis on her.  If they are normal, she should refer back to her pcp for possible musculoskeletal pain.  Pt agrees.  She will go to [facility] lab tomorrow morning.  She has no ride today.

## 2012-09-29 ENCOUNTER — Telehealth (INDEPENDENT_AMBULATORY_CARE_PROVIDER_SITE_OTHER): Payer: Self-pay

## 2012-09-29 NOTE — Telephone Encounter (Signed)
See message below °

## 2012-09-29 NOTE — Telephone Encounter (Signed)
Message copied by Ivory Broad on Thu Sep 29, 2012  1:36 PM ------      Message from: Parks Neptune      Created: Thu Sep 29, 2012 12:22 PM      Contact: [phone]       Pt made appt with PCP re back pain.  Just wanted you know that.  DF ------

## 2012-10-03 ENCOUNTER — Ambulatory Visit: Payer: Medicaid Other

## 2012-10-03 ENCOUNTER — Telehealth: Payer: Self-pay | Admitting: Oncology

## 2012-10-03 ENCOUNTER — Other Ambulatory Visit: Payer: Self-pay | Admitting: *Deleted

## 2012-10-03 ENCOUNTER — Encounter: Payer: Self-pay | Admitting: Oncology

## 2012-10-03 ENCOUNTER — Ambulatory Visit (HOSPITAL_BASED_OUTPATIENT_CLINIC_OR_DEPARTMENT_OTHER): Payer: Medicaid Other | Admitting: Oncology

## 2012-10-03 VITALS — BP 126/82 | HR 77 | Temp 98.7°F | Resp 20 | Ht 62.0 in | Wt 236.9 lb

## 2012-10-03 DIAGNOSIS — C184 Malignant neoplasm of transverse colon: Secondary | ICD-10-CM

## 2012-10-03 NOTE — Progress Notes (Signed)
Met with Lauren Norris and family. Explained role of nurse navigator. Educational information provided on colon cancer along with [facility] resource sheet and phone numbers.Referral made to dietician for diet education. Patient understands to have follow-up colonoscopy in 1 year from last.  She has a genetics appointment already scheduled.

## 2012-10-03 NOTE — Progress Notes (Signed)
Checked in new pt with no financial concerns. °

## 2012-10-03 NOTE — Progress Notes (Signed)
[facility] New Patient Consult   Referring MD: Desia Saban 47 y.o.  1965-08-08    Reason for Referral: Colon cancer     HPI: She underwent a screening colonoscopy by Dr. Bosie Clos on 07/15/2012. A polypoid nonobstructing large mass was found in the transverse colon. Biopsies were taken. The area was tattooed. Internal hemorrhoids were noted. The biopsy([id number]) revealed multiple fragments of a tubular adenoma with high-grade dysplasia.  She was referred to Dr. Derrell Lolling. CT scans of the abdomen and pelvis on 08/24/2012 found the lung bases to be clear. Diffuse fatty infiltration of the liver without a focal hepatic lesion. Focal asymmetric wall thickening involves the mid transverse colon no CT scan findings were adjacent lymphadenopathy or invasion of the omentum. No adenopathy. The uterus and ovaries appeared unremarkable.  She was taken to a laparoscopic-assisted extended right hemicolectomy on 09/07/2012. A multilobulated polypoid mass was noted at the transverse colon. No adenopathy. No evidence of liver metastases. An anastomosis was created between the terminal ileum and left transverse colon.  The pathology [id number]) revealed an invasive adenocarcinoma arising in a background of tubular adenoma. The tumor was moderately differentiated and invaded into the submucosa. No macroscopic tumor perforation was appreciated. There was no lymphovascular or perineural invasion. An additional tubular adenoma was identified. 18 lymph nodes were negative for metastatic adenocarcinoma. The resection margins were negative. A prominent Crohn's like reaction was noted at the periphery of the tumor.  The tumor returned microsatellite stable with no loss of expression of mismatch repair proteins.  She reports feeling well prior to surgery.  Past Medical History  Diagnosis Date  .  G2, P1, 1 Ms. miscarriage    . Migraine   .  zoster infection at the left  anterior chest   2 years ago   . Hot flashes     VIVELLE DOT PATCH HAS HELPED STOP HOT FLASHES  . Bulging disc     LUMBAR - LOWER BACK PAIN AND NUMBNES DOWN RIGHT LEG  . Mass of colon  June 2014     NEOPLASTIC MASS TRANSVERSE COLON - FOUND ON ROUTINE COLONSCOPY SCREENING   .   Transverse colon cancer, stage I (T1 N0)                                                        09/07/2012  Past Surgical History  Procedure Laterality Date  . Dilation and curettage of uterus    . Cesarean section  07/12/1998  . Laparoscopic partial colectomy N/A 09/07/2012    Procedure: LAPAROSCOPIC ASSISTED TRANSVERSE COLECTOMY;  Surgeon: Ernestene Mention, MD;  Location: [facility];  Service: General;  Laterality: N/A;    Family History  Problem Relation Age of Onset  . Cancer Paternal Grandmother  34     Colon  . Cancer - Colon Father   . Cancer Father     Colon  . Cancer - Other Maternal Aunt     myloma  . Cancer Maternal Aunt     Multi Myeloma  . Cancer - Lung Paternal Grandfather   . Cancer Paternal Grandfather     Lung  . Cancer - Colon Paternal Aunt   . Cancer Paternal Uncle     Colon  . Cancer Maternal Grandmother      uterine    .  Colon cancer                                   paternal great-grandmother  .   One brother and one sister with colon polyps  Current outpatient prescriptions:cyclobenzaprine (FLEXERIL) 10 MG tablet, Take 10 mg by mouth 3 (three) times daily as needed for muscle spasms., Disp: , Rfl: ;  estradiol (VIVELLE-DOT) 0.1 MG/24HR patch, Place 1 patch onto the skin 2 (two) times a week., Disp: , Rfl: ;  alprazolam (XANAX) 2 MG tablet, Take 2 mg by mouth at bedtime as needed for sleep. , Disp: , Rfl:  Aspirin-Acetaminophen-Caffeine (GOODY HEADACHE PO), Take 1 packet by mouth every 6 (six) hours as needed (headache)., Disp: , Rfl: ;  HYDROcodone-acetaminophen (NORCO) 5-325 MG per tablet, Take 1 tablet by mouth every 6 (six) hours as needed for pain (Every 4-6 hours)., Disp: 30  tablet, Rfl: 0;  ibuprofen (ADVIL,MOTRIN) 200 MG tablet, Take 400 mg by mouth every 6 (six) hours as needed for pain., Disp: , Rfl:   Allergies:  Allergies  Allergen Reactions  . Codeine     Upset stomach    Social History: She lives with her husband in [place], currently unemployed. She does not use tobacco . No heavy alcohol use. No transfusion history. No risk factor for HIV or hepatitis.   ROS:   Positives include: Hot flashes/night sweats since menopause at age 87, 40 pound weight gain since losing her job, chronic intermittent back pain, occasional headaches  A complete ROS was otherwise negative.  Physical Exam:  Blood pressure 126/82, pulse 77, temperature 98.7 F (37.1 C), temperature source Oral, resp. rate 20, height 5\' 2"  (1.575 m), weight 236 lb 14.4 oz (107.457 kg).  HEENT: Oropharynx without visible mass, neck without mass Lungs: Clear bilaterally Cardiac: Regular rate and rhythm Abdomen: Healed surgical incisions, no hepatomegaly, no mass, nontender  Vascular: No leg edema Lymph nodes: No cervical, supra-clavicular, axillary, or inguinal nodes Neurologic: Alert and oriented, the motor exam appears intact in the upper and lower extremity Skin: No rash Musculoskeletal: Mild tenderness at the lower thoracic spine   LAB:  CBC  Lab Results  Component Value Date   WBC 8.0 09/10/2012   HGB 12.2 09/10/2012   HCT 37.4 09/10/2012   MCV 87.2 09/10/2012   PLT 243 09/10/2012     CMP      Component Value Date/Time   NA 140 09/10/2012 0435   K 3.8 09/10/2012 0435   CL 104 09/10/2012 0435   CO2 30 09/10/2012 0435   GLUCOSE 119* 09/10/2012 0435   BUN 5* 09/10/2012 0435   CREATININE 0.73 09/10/2012 0435   CALCIUM 9.1 09/10/2012 0435   PROT 6.9 09/01/2012 0945   ALBUMIN 3.8 09/01/2012 0945   AST 21 09/01/2012 0945   ALT 35 09/01/2012 0945   ALKPHOS 99 09/01/2012 0945   BILITOT 0.6 09/01/2012 0945   GFRNONAA >90 09/10/2012 0435   GFRAA >90 09/10/2012 0435   CEA less  than 0.5 on 09/01/2012  Radiology: As per history of present illness, chest x-ray 09/01/2012-no active disease    Assessment/Plan:   1. Stage I (T1 N0) moderately differentiated adenocarcinoma of the transverse colon, status post an extended right colectomy, the tumor returned microsatellite stable with no loss of mismatch repair protein expression  2. Family history of colon cancer  3. Additional colonic polyp (s) on the right/transverse colon specimen-we will  clarify the number of polyps at the GI tumor conference this week.   Disposition:   Ms. Aune has been diagnosed with stage I colon cancer. I discussed the diagnosis, prognosis, and adjuvant treatment options with Ms. Dovel and her family. We reviewed the details of the surgical pathology report. She has a good prognosis for a long-term disease-free survival. I do not recommend adjuvant chemotherapy.  She has an extensive family history of colon cancer. She has been referred to the genetics counselor to discuss the indication for additional genetic testing.  We discussed strategies to decrease the risk of developing a new Colon Cancer or recurrent disease including a low-fat diet and exercise. We also discussed the potential benefit of aspirin therapy, though this is not yet a standard recommendation.  She should continue colonoscopy followup with Dr. Bosie Clos with the next colonoscopy to be scheduled in one year.  She is not scheduled for a [facility] appointment at the [facility]. We will be glad to see her in the future as needed.  Approximately 50 minutes were spent with the patient and her family today. The majority of the time was used for counseling and coordination of care.  Dorea Duff 10/03/2012, 5:16 PM

## 2012-10-03 NOTE — Telephone Encounter (Signed)
, °

## 2012-10-10 ENCOUNTER — Encounter: Payer: Medicaid Other | Admitting: Nutrition

## 2012-10-10 ENCOUNTER — Encounter: Payer: Self-pay | Admitting: Nutrition

## 2012-10-10 NOTE — Progress Notes (Signed)
Patient did not show up for nutrition appointment. 

## 2012-10-26 ENCOUNTER — Telehealth (INDEPENDENT_AMBULATORY_CARE_PROVIDER_SITE_OTHER): Payer: Self-pay

## 2012-10-26 NOTE — Telephone Encounter (Signed)
Pt called this morning wanting to reschedule her follow up appointment.  I have one next Friday at 3:30 on 10/17.  I left a message for her to call me and see if she can come then.

## 2012-10-27 ENCOUNTER — Encounter (INDEPENDENT_AMBULATORY_CARE_PROVIDER_SITE_OTHER): Payer: Medicaid Other | Admitting: General Surgery

## 2012-10-28 NOTE — Telephone Encounter (Signed)
Patient returned the call and I informed her of the message below.  She was okay with this date and time.

## 2012-11-04 ENCOUNTER — Ambulatory Visit (INDEPENDENT_AMBULATORY_CARE_PROVIDER_SITE_OTHER): Payer: Medicaid Other | Admitting: General Surgery

## 2012-11-04 ENCOUNTER — Encounter (INDEPENDENT_AMBULATORY_CARE_PROVIDER_SITE_OTHER): Payer: Self-pay | Admitting: General Surgery

## 2012-11-04 VITALS — BP 128/80 | HR 74 | Temp 97.3°F | Resp 16 | Ht 62.0 in | Wt 238.4 lb

## 2012-11-04 DIAGNOSIS — C184 Malignant neoplasm of transverse colon: Secondary | ICD-10-CM

## 2012-11-04 NOTE — Patient Instructions (Signed)
You appear to have recovered from your colon surgery without any apparent surgical complications.  It is good that Dr. Truett Perna thinks that you do not need any other form of treatment  Keep your genetics counseling appointment in November  Repeat colonoscopy with Dr. Bosie Clos in July of 2015  Return to see Dr. Derrell Lolling in August 2015. We will schedule lab work at that time.

## 2012-11-04 NOTE — Progress Notes (Signed)
Patient ID: Lauren Norris, female   DOB: 04-18-1965, 47 y.o.   MRN: [id number]  History:  Lauren Norris underwent extended right colectomy on 09/07/2012 for a neoplastic mass of the mid transverse colon. Final pathology report showed invasive adenocarcinoma arising in a background of tubular adenoma. This invaded the submucosa. 18 lymph nodes negative. Pathologic stage TI, N0, stage I.  She is doing well surgically. Abdominal soreness is subsiding. She's having 2 or 3 formed bowel movements per day. She can eat what she wants. Weight has been stable. She is fully active. She saw Dr. Truett Perna who counseled her and advised against any chemotherapy. She was discharged from his care.  She   has been referred to genetic counseling because of her family history and that appointment is on November 3 of this year.  Exam: Patient looks well. No distress Abdomen obese. Soft. Nontender. Incision is healing well. No hernias.    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 No restrictions Genetic counseling 11/21/2012. This was encouraged She was about to get a colonoscopy with Dr. Bosie Clos in July 2015 No indication for screening CT scan Return to see me in August 2015. We'll get a CBC, complete metabolic panel, and CEA at that time.   Angelia Mould. Derrell Lolling, M.D., [facility] General and Minimally invasive Surgery Breast and Colorectal Surgery Office:   [phone] Pager:   [phone]

## 2012-11-16 ENCOUNTER — Encounter (HOSPITAL_COMMUNITY): Payer: Self-pay

## 2012-11-21 ENCOUNTER — Other Ambulatory Visit: Payer: Medicaid Other | Admitting: Lab

## 2012-11-21 ENCOUNTER — Ambulatory Visit (HOSPITAL_BASED_OUTPATIENT_CLINIC_OR_DEPARTMENT_OTHER): Payer: Medicaid Other | Admitting: Genetic Counselor

## 2012-11-21 ENCOUNTER — Encounter (INDEPENDENT_AMBULATORY_CARE_PROVIDER_SITE_OTHER): Payer: Self-pay

## 2012-11-21 ENCOUNTER — Encounter: Payer: Self-pay | Admitting: Genetic Counselor

## 2012-11-21 DIAGNOSIS — C184 Malignant neoplasm of transverse colon: Secondary | ICD-10-CM

## 2012-11-21 DIAGNOSIS — Z800 Family history of malignant neoplasm of digestive organs: Secondary | ICD-10-CM

## 2012-11-21 DIAGNOSIS — Z8041 Family history of malignant neoplasm of ovary: Secondary | ICD-10-CM

## 2012-11-21 DIAGNOSIS — Z801 Family history of malignant neoplasm of trachea, bronchus and lung: Secondary | ICD-10-CM

## 2012-11-21 DIAGNOSIS — IMO0002 Reserved for concepts with insufficient information to code with codable children: Secondary | ICD-10-CM

## 2012-11-21 NOTE — Progress Notes (Signed)
Dr.  Thornton Papas requested a consultation for genetic counseling and risk assessment for Lauren Norris, a 47 y.o. female, for discussion of her personal and family history of colon cancer.  She presents to clinic today to discuss the possibility of a genetic predisposition to cancer, and to further clarify her risks, as well as her family members' risks for cancer.   HISTORY OF PRESENT ILLNESS: In August 2014, at the age of 6, Lauren Norris was diagnosed with cancer of the transverse of the colon. This was treated with surgery, but will not be treated with chemotherapy or radiation.  She is scheduled for another colonoscopy in one year.   MSI testing was stable and IHC showed no loss of function, therefore suggesting that this colon cancer was not the results of Lynch syndrome.   Past Medical History  Diagnosis Date  . Fatigue   . Migraine   . Palpitations   . Hot flashes     VIVELLE DOT PATCH HAS HELPED STOP HOT FLASHES  . Bulging disc     LUMBAR - LOWER BACK PAIN AND NUMBNES DOWN RIGHT LEG  . Mass of colon     NEOPLASTIC MASS TRANSVERSE COLON - FOUND ON ROUTINE COLONSCOPY SCREENING  . Colon cancer 2014    transverse colon    Past Surgical History  Procedure Laterality Date  . Dilation and curettage of uterus    . Cesarean section  07/12/1998  . Laparoscopic partial colectomy N/A 09/07/2012    Procedure: LAPAROSCOPIC ASSISTED TRANSVERSE COLECTOMY;  Surgeon: Ernestene Mention, MD;  Location: [facility];  Service: General;  Laterality: N/A;    History   Social History  . Marital Status: Married    Spouse Name: N/A    Number of Children: 1  . Years of Education: N/A   Occupational History  . unemployed    Social History Main Topics  . Smoking status: Former Games developer  . Smokeless tobacco: Former Neurosurgeon    Quit date: 08/09/1998  . Alcohol Use: Yes     Comment: 2-3 times week  . Drug Use: No  . Sexual Activity: Yes    Partners: Male   Other Topics Concern  . Not on file    Social History Narrative  . No narrative on file    REPRODUCTIVE HISTORY AND PERSONAL RISK ASSESSMENT FACTORS: Menarche was at age 53-12.   postmenopausal Uterus Intact: yes Ovaries Intact: yes G2P1A1, first live birth at age 79  She has not previously undergone treatment for infertility.   Oral Contraceptive use: 19 years   She has not used HRT in the past.    FAMILY HISTORY:  We obtained a detailed, 4-generation family history.  Significant diagnoses are listed below: Family History  Problem Relation Age of Onset  . Cancer - Colon Paternal Grandmother 51  . Cancer - Colon Father 32  . Multiple myeloma Maternal Aunt     dx between 50-60  . Cancer - Lung Paternal Grandfather 21  . Cancer - Colon Paternal Uncle 89  . Ovarian cancer Maternal Grandmother     dx in her 83s  . Colon polyps Brother 28  . Colon polyps Sister 56  . Cancer - Lung Maternal Uncle     smoker  . Cancer - Colon Paternal Aunt     dx in her 6s-50s  . Cancer - Colon Other     paternal great aunt (grandmother's sister) dx between 68-70  . Cancer - Colon Other  paternal grandmother's mother    Patient's maternal ancestors are of [place] descent, and paternal ancestors are of [place] descent. There is no reported Ashkenazi Jewish ancestry. There is no known consanguinity.  GENETIC COUNSELING ASSESSMENT: Lauren Norris is a 47 y.o. female with a personal history of colon cancer and family history of colon cancer, colon polyps and ovarian cancer which somewhat suggestive of a hereditary cancer syndrome and predisposition to cancer. We, therefore, discussed and recommended the following at today's visit.   DISCUSSION: We reviewed the characteristics, features and inheritance patterns of hereditary cancer syndromes. We also discussed genetic testing, including the appropriate family members to test, the process of testing, insurance coverage and turn-around-time for results. We discussed that about 3%  of colon cancer is the result of Lynch syndrome, and another 1% is the result of APC mutations.  We discussed that FAP has a spectrum with the mild end having Attenuated FAP and the severe end having classic FAP symptoms.  Her tumor testing suggests that her cancer is not a Lynch related cancer.  Lauren Norris maternal grandmother had ovarian cancer.  Therefore, Lauren Norris meets the criteria for genetic testing for BRCA mutations.  We reviewed BRCA mutations, and associated risks for cancer.  [place] Medicaid does not pay for genetic testing for cancer, however, [facility] will accept [place] Medicaid.  [facility] offers BRCA testing and Lynch syndrome testing, but no other hereditary cancer testing.  Therefore, if she is negative for Lynch syndrome, we suggest that other family members, such as one of her siblings or her father, get testing with a hereditary colon cancer panel.  If they test positive for a mutation, we could then test Lauren Norris for the presence or absence of that mutation.  In order to estimate her chance of having a MMR mutation, we used statistical models (Prem1,2,6) and laboratory data that take into account her personal medical history, family history and ancestry.  Because each model is different, there can be a lot of variability in the risks they give.  Therefore, these numbers must be considered a rough range and not a precise risk of having a MMR mutation.  These models estimate that she has approximately a 9.6% chance of having a mutation. Based on this assessment of her family and personal history, genetic testing is recommended.  In order to estimate her chance of having a BRCA mutation, we used statistical models (Penn II and Myriad Risk Calculator) and laboratory data that take into account her personal medical history, family history and ancestry.  Because each model is different, there can be a lot of variability in the risks they give.  Therefore, these numbers must be considered a rough  range and not a precise risk of having a BRCA mutation.  These models estimate that she has approximately a 3% chance of having a mutation. Based on this assessment of her family and personal history, genetic testing is recommended.  PLAN: After considering the risks, benefits, and limitations, Lauren Norris provided informed consent to pursue genetic testing and the blood sample will be sent to [facility] for analysis of the [employer] and Lynch Syndrome testing. We discussed the implications of a positive, negative and/ or variant of uncertain significance genetic test result. Results should be available within approximately 3-6 weeks' time, at which point they will be disclosed by telephone to [facility], as will any additional recommendations warranted by these results. Lauren Norris will receive a summary of her genetic counseling visit and a  copy of her results once available. This information will also be available in Epic. We encouraged Lauren Norris to remain in contact with cancer genetics annually so that we can continuously update the family history and inform her of any changes in cancer genetics and testing that may be of benefit for her family. Lauren Norris's questions were answered to her satisfaction today. Our contact information was provided should additional questions or concerns arise.  The patient was seen for a total of 60 minutes, greater than 50% of which was spent face-to-face counseling.  This note will also be sent to the referring provider via the electronic medical record. The patient will be supplied with a summary of this genetic counseling discussion as well as educational information on the discussed hereditary cancer syndromes following the conclusion of their visit.   Patient was discussed with Dr. Drue Second.   _______________________________________________________________________ For Office Staff:  Number of people involved in session: 1 Was an  Intern/ student involved with case: yes

## 2012-12-12 ENCOUNTER — Emergency Department (HOSPITAL_COMMUNITY)
Admission: EM | Admit: 2012-12-12 | Discharge: 2012-12-12 | Disposition: A | Discharge status: Home / Self Care | Payer: Medicaid Other | Attending: Emergency Medicine | Admitting: Emergency Medicine

## 2012-12-12 ENCOUNTER — Telehealth: Payer: Self-pay | Admitting: Genetic Counselor

## 2012-12-12 ENCOUNTER — Emergency Department (HOSPITAL_COMMUNITY): Payer: Medicaid Other

## 2012-12-12 ENCOUNTER — Encounter (HOSPITAL_COMMUNITY): Payer: Self-pay | Admitting: Emergency Medicine

## 2012-12-12 DIAGNOSIS — Z3202 Encounter for pregnancy test, result negative: Secondary | ICD-10-CM | POA: Insufficient documentation

## 2012-12-12 DIAGNOSIS — K5792 Diverticulitis of intestine, part unspecified, without perforation or abscess without bleeding: Secondary | ICD-10-CM

## 2012-12-12 DIAGNOSIS — K5732 Diverticulitis of large intestine without perforation or abscess without bleeding: Secondary | ICD-10-CM | POA: Insufficient documentation

## 2012-12-12 DIAGNOSIS — Z79899 Other long term (current) drug therapy: Secondary | ICD-10-CM | POA: Insufficient documentation

## 2012-12-12 DIAGNOSIS — Z85038 Personal history of other malignant neoplasm of large intestine: Secondary | ICD-10-CM | POA: Insufficient documentation

## 2012-12-12 DIAGNOSIS — Z8739 Personal history of other diseases of the musculoskeletal system and connective tissue: Secondary | ICD-10-CM | POA: Insufficient documentation

## 2012-12-12 DIAGNOSIS — Z8679 Personal history of other diseases of the circulatory system: Secondary | ICD-10-CM | POA: Insufficient documentation

## 2012-12-12 LAB — URINALYSIS, ROUTINE W REFLEX MICROSCOPIC
Bilirubin Urine: NEGATIVE
Glucose, UA: NEGATIVE mg/dL
Hgb urine dipstick: NEGATIVE
Ketones, ur: NEGATIVE mg/dL
Protein, ur: NEGATIVE mg/dL
pH: 6 (ref 5.0–8.0)

## 2012-12-12 LAB — CBC WITH DIFFERENTIAL/PLATELET
Basophils Absolute: 0 10*3/uL (ref 0.0–0.1)
Basophils Relative: 0 % (ref 0–1)
Eosinophils Absolute: 0.1 10*3/uL (ref 0.0–0.7)
Eosinophils Relative: 2 % (ref 0–5)
HCT: 40.8 % (ref 36.0–46.0)
Hemoglobin: 13.4 g/dL (ref 12.0–15.0)
MCH: 27.5 pg (ref 26.0–34.0)
MCHC: 32.8 g/dL (ref 30.0–36.0)
Monocytes Absolute: 0.7 10*3/uL (ref 0.1–1.0)
Monocytes Relative: 10 % (ref 3–12)
RDW: 13.2 % (ref 11.5–15.5)

## 2012-12-12 LAB — COMPREHENSIVE METABOLIC PANEL
AST: 24 U/L (ref 0–37)
Albumin: 3.9 g/dL (ref 3.5–5.2)
BUN: 11 mg/dL (ref 6–23)
Calcium: 9.2 mg/dL (ref 8.4–10.5)
Creatinine, Ser: 0.6 mg/dL (ref 0.50–1.10)
Potassium: 3.5 mEq/L (ref 3.5–5.1)
Sodium: 137 mEq/L (ref 135–145)
Total Bilirubin: 0.3 mg/dL (ref 0.3–1.2)
Total Protein: 7.1 g/dL (ref 6.0–8.3)

## 2012-12-12 LAB — PREGNANCY, URINE: Preg Test, Ur: NEGATIVE

## 2012-12-12 MED ORDER — ONDANSETRON HCL 4 MG/2ML IJ SOLN
4.0000 mg | Freq: Once | INTRAMUSCULAR | Status: AC
  Administered 2012-12-12: 4 mg via INTRAVENOUS
  Filled 2012-12-12: qty 2

## 2012-12-12 MED ORDER — METRONIDAZOLE 500 MG PO TABS: 500.0000 mg | ORAL_TABLET | Freq: Two times a day (BID) | ORAL | Status: DC

## 2012-12-12 MED ORDER — CIPROFLOXACIN HCL 500 MG PO TABS: 500.0000 mg | ORAL_TABLET | Freq: Two times a day (BID) | ORAL | Status: DC

## 2012-12-12 MED ORDER — IOHEXOL 300 MG/ML  SOLN
50.0000 mL | Freq: Once | INTRAMUSCULAR | Status: AC | PRN
  Administered 2012-12-12: 50 mL via ORAL

## 2012-12-12 MED ORDER — METRONIDAZOLE IN NACL 5-0.79 MG/ML-% IV SOLN
500.0000 mg | Freq: Once | INTRAVENOUS | Status: AC
  Administered 2012-12-12: 500 mg via INTRAVENOUS
  Filled 2012-12-12: qty 100

## 2012-12-12 MED ORDER — MORPHINE SULFATE 4 MG/ML IJ SOLN
4.0000 mg | Freq: Once | INTRAMUSCULAR | Status: AC
  Administered 2012-12-12: 4 mg via INTRAVENOUS
  Filled 2012-12-12: qty 1

## 2012-12-12 MED ORDER — IOHEXOL 300 MG/ML  SOLN
100.0000 mL | Freq: Once | INTRAMUSCULAR | Status: AC | PRN
  Administered 2012-12-12: 100 mL via INTRAVENOUS

## 2012-12-12 MED ORDER — OXYCODONE-ACETAMINOPHEN 5-325 MG PO TABS: 2.0000 | ORAL_TABLET | ORAL | Status: DC | PRN

## 2012-12-12 MED ORDER — CIPROFLOXACIN IN D5W 400 MG/200ML IV SOLN
400.0000 mg | Freq: Once | INTRAVENOUS | Status: AC
  Administered 2012-12-12: 400 mg via INTRAVENOUS
  Filled 2012-12-12: qty 200

## 2012-12-12 NOTE — ED Notes (Signed)
Pt ambulated to nearby bathroom.

## 2012-12-12 NOTE — ED Notes (Signed)
Patient reports that she has had urinary frequency, pressure, chills, nausea, and lower abdominal pain x 2 days. Patient denies any vaginal discharge.

## 2012-12-12 NOTE — ED Notes (Signed)
Pt reports urinary frequency and inability to "finish peeing"; feels like not emptying bladder when gone to pee. Pt reports nausea denies vomiting. Pt reports abd pain with left lower quadrant tenderness.

## 2012-12-12 NOTE — ED Notes (Signed)
Pt ambulated to nearby restroom to void.  

## 2012-12-12 NOTE — Telephone Encounter (Signed)
Left good news message on VM. 

## 2012-12-12 NOTE — ED Provider Notes (Signed)
CSN: [id number]     Arrival date & time 12/12/12  1418 History   First MD Initiated Contact with Patient 12/12/12 1503     Chief Complaint  Patient presents with  . Urinary Tract Infection   (Consider location/radiation/quality/duration/timing/severity/associated sxs/prior Treatment) HPI Comments: Patient presents to the ED with a chief complaint of lower abdominal pain that started yesterday.  She also endorses associated urinary hesitancy and frequency.  She denies fevers, but states that she has had subjective chills, and nausea.  She denies any vomiting or changes in bowel movements.  Denies seeing any blood in her stools.  She underwent recent colectomy in August for colon cancer, but is not undergoing any further treatments.  She is followed by Dr. Derrell Lolling.  She endorses moderate pain, nothing makes her symptoms better or worse.  Patient is a 47 y.o. female presenting with urinary tract infection. The history is provided by the patient. No language interpreter was used.  Urinary Tract Infection    Past Medical History  Diagnosis Date  . Fatigue   . Migraine   . Palpitations   . Hot flashes     VIVELLE DOT PATCH HAS HELPED STOP HOT FLASHES  . Bulging disc     LUMBAR - LOWER BACK PAIN AND NUMBNES DOWN RIGHT LEG  . Mass of colon     NEOPLASTIC MASS TRANSVERSE COLON - FOUND ON ROUTINE COLONSCOPY SCREENING  . Colon cancer 2014    transverse colon   Past Surgical History  Procedure Laterality Date  . Dilation and curettage of uterus    . Cesarean section  07/12/1998  . Laparoscopic partial colectomy N/A 09/07/2012    Procedure: LAPAROSCOPIC ASSISTED TRANSVERSE COLECTOMY;  Surgeon: Ernestene Mention, MD;  Location: [facility];  Service: General;  Laterality: N/A;   Family History  Problem Relation Age of Onset  . Cancer - Colon Paternal Grandmother 54  . Cancer - Colon Father 17  . Multiple myeloma Maternal Aunt     dx between 50-60  . Cancer - Lung Paternal Grandfather 59  .  Cancer - Colon Paternal Uncle 50  . Ovarian cancer Maternal Grandmother     dx in her 38s  . Colon polyps Brother 74  . Colon polyps Sister 64  . Cancer - Lung Maternal Uncle     smoker  . Cancer - Colon Paternal Aunt     dx in her 91s-50s  . Cancer - Colon Other     paternal great aunt (grandmother's sister) dx between 75-70  . Cancer - Colon Other     paternal grandmother's mother   History  Substance Use Topics  . Smoking status: Never Smoker   . Smokeless tobacco: Never Used  . Alcohol Use: Yes     Comment: 2-3 times week   OB History   Grav Para Term Preterm Abortions TAB SAB Ect Mult Living   2 1 1  1  1   1      Review of Systems  All other systems reviewed and are negative.    Allergies  Review of patient's allergies indicates no known allergies.  Home Medications   Current Outpatient Rx  Name  Route  Sig  Dispense  Refill  . alprazolam (XANAX) 2 MG tablet   Oral   Take 2 mg by mouth at bedtime as needed for sleep.          Marland Kitchen estradiol (VIVELLE-DOT) 0.1 MG/24HR patch   Transdermal   Place 1 patch onto  the skin 2 (two) times a week.         Marland Kitchen ibuprofen (ADVIL,MOTRIN) 200 MG tablet   Oral   Take 400 mg by mouth every 6 (six) hours as needed for pain.          BP 160/97  Pulse 78  Temp(Src) 97.6 F (36.4 C) (Oral)  Resp 18  SpO2 97% Physical Exam  Nursing note and vitals reviewed. Constitutional: She is oriented to person, place, and time. She appears well-developed and well-nourished.  HENT:  Head: Normocephalic and atraumatic.  Eyes: Conjunctivae and EOM are normal. Pupils are equal, round, and reactive to light.  Neck: Normal range of motion. Neck supple.  Cardiovascular: Normal rate and regular rhythm.  Exam reveals no gallop and no friction rub.   No murmur heard. Pulmonary/Chest: Effort normal and breath sounds normal. No respiratory distress. She has no wheezes. She has no rales. She exhibits no tenderness.  Abdominal: Soft. Bowel  sounds are normal. She exhibits no distension and no mass. There is tenderness. There is no rebound and no guarding.  LLQ abdominal tenderness, no other focal tenderness, no fluid wave or peritoneal signs  Musculoskeletal: Normal range of motion. She exhibits no edema and no tenderness.  Neurological: She is alert and oriented to person, place, and time.  Skin: Skin is warm and dry.  Well healing midline scar on abdomen  Psychiatric: She has a normal mood and affect. Her behavior is normal. Judgment and thought content normal.    ED Course  Procedures (including critical care time) Results for orders placed during the hospital encounter of 12/12/12  URINALYSIS, ROUTINE W REFLEX MICROSCOPIC      Result Value Range   Color, Urine YELLOW  YELLOW   APPearance CLEAR  CLEAR   Specific Gravity, Urine 1.013  1.005 - 1.030   pH 6.0  5.0 - 8.0   Glucose, UA NEGATIVE  NEGATIVE mg/dL   Hgb urine dipstick NEGATIVE  NEGATIVE   Bilirubin Urine NEGATIVE  NEGATIVE   Ketones, ur NEGATIVE  NEGATIVE mg/dL   Protein, ur NEGATIVE  NEGATIVE mg/dL   Urobilinogen, UA 0.2  0.0 - 1.0 mg/dL   Nitrite NEGATIVE  NEGATIVE   Leukocytes, UA NEGATIVE  NEGATIVE  CBC WITH DIFFERENTIAL      Result Value Range   WBC 7.6  4.0 - 10.5 K/uL   RBC 4.88  3.87 - 5.11 MIL/uL   Hemoglobin 13.4  12.0 - 15.0 g/dL   HCT 40.9  81.1 - 91.4 %   MCV 83.6  78.0 - 100.0 fL   MCH 27.5  26.0 - 34.0 pg   MCHC 32.8  30.0 - 36.0 g/dL   RDW 78.2  95.6 - 21.3 %   Platelets 281  150 - 400 K/uL   Neutrophils Relative % 59  43 - 77 %   Neutro Abs 4.5  1.7 - 7.7 K/uL   Lymphocytes Relative 29  12 - 46 %   Lymphs Abs 2.2  0.7 - 4.0 K/uL   Monocytes Relative 10  3 - 12 %   Monocytes Absolute 0.7  0.1 - 1.0 K/uL   Eosinophils Relative 2  0 - 5 %   Eosinophils Absolute 0.1  0.0 - 0.7 K/uL   Basophils Relative 0  0 - 1 %   Basophils Absolute 0.0  0.0 - 0.1 K/uL  COMPREHENSIVE METABOLIC PANEL      Result Value Range   Sodium 137  135 - 145  mEq/L   Potassium 3.5  3.5 - 5.1 mEq/L   Chloride 103  96 - 112 mEq/L   CO2 26  19 - 32 mEq/L   Glucose, Bld 103 (*) 70 - 99 mg/dL   BUN 11  6 - 23 mg/dL   Creatinine, Ser 1.61  0.50 - 1.10 mg/dL   Calcium 9.2  8.4 - 09.6 mg/dL   Total Protein 7.1  6.0 - 8.3 g/dL   Albumin 3.9  3.5 - 5.2 g/dL   AST 24  0 - 37 U/L   ALT 50 (*) 0 - 35 U/L   Alkaline Phosphatase 124 (*) 39 - 117 U/L   Total Bilirubin 0.3  0.3 - 1.2 mg/dL   GFR calc non Af Amer >90  >90 mL/min   GFR calc Af Amer >90  >90 mL/min  PREGNANCY, URINE      Result Value Range   Preg Test, Ur NEGATIVE  NEGATIVE   Ct Abdomen Pelvis W Contrast  12/12/2012   CLINICAL DATA:  UTI, dysuria  EXAM: CT ABDOMEN AND PELVIS WITH CONTRAST  TECHNIQUE: Multidetector CT imaging of the abdomen and pelvis was performed using the standard protocol following bolus administration of intravenous contrast.  CONTRAST:  50mL OMNIPAQUE IOHEXOL 300 MG/ML SOLN, OMNIPAQUE IOHEXOL 300 MG/ML SOLN  COMPARISON:  08/24/2012  FINDINGS: Sagittal images of the spine shows mild degenerative changes lower thoracic spine. Mild hepatic fatty infiltration. Lung bases are unremarkable. No calcified gallstones are noted within gallbladder. The pancreas, spleen and adrenal glands are unremarkable. Kidneys are symmetrical in size and enhancement. Delayed renal images shows bilateral renal symmetrical excretion. There is mild fullness of left renal collecting system without frank hydronephrosis. Mild distension of left ureter.  No aortic aneurysm. No small bowel obstruction. No free abdominal air. No abdominal ascites. The patient is status post right hemicolectomy. Some stool noted in distal colon. No small bowel or colonic obstruction.  Scattered diverticula are noted sigmoid colon. In axial image 74 there is mild stranding of pericolonic fat in mid sigmoid colon in left pelvis. Small amount of fluid is noted at this level. Findings are highly suspicious for diverticulitis or  focal mesenteric fat inflammation. No pericolonic abscess is noted. There is mild prominence of the left ureter at this level. Inflammatory changes within left ureter cannot be excluded probable from adjacent pericolonic inflammation. The uterus is hyper anteflexed. Mild distended urinary bladder.  IMPRESSION: 1. Scattered diverticula are noted sigmoid colon. There is mild stranding of pericolonic fat in mid sigmoid colon in left pelvis. Small amount of pericolonic fluid. Findings highly suspicious for diverticulitis or focal mesenteric panniculitis. No pericolonic abscess. Mild prominence of left ureter at this level may be due to satellite inflammatory changes. 2. Mild distended urinary bladder. 3. Hyper flexed uterus. 4. Status post right hemicolectomy. Critical Value/emergent results were called by telephone at the time of interpretation on 12/12/2012 at 5:03 PM to Dr.Brooksie Ellwanger , who verbally acknowledged these results.   Electronically Signed   By: Natasha Mead M.D.   On: 12/12/2012 17:04     EKG Interpretation   None       MDM   1. Diverticulitis     Patient with abdominal pain, and urinary hesitancy/frequency.  Will check labs.  Will order CT abd/pel, as the patient has significant LLQ pain, and recently underwent colectomy.  CT shows diverticulitis vs panniculitis.  Inflammation is extending to the left ureter, which can very easily explain the patient's urinary symptoms.  Treat with Cipro and Flagyl. Patient is tolerating oral intake. She is nonseptic appearing, and is stable for outpatient treatment. Will give an initial dose of Cipro and Flagyl IV while in the emergency department.  Patient discussed with Dr. Freida Busman, who agrees with the plan.   Roxy Horseman, PA-C 12/12/12 1914

## 2012-12-12 NOTE — ED Notes (Signed)
Pt to CT

## 2012-12-13 ENCOUNTER — Encounter (INDEPENDENT_AMBULATORY_CARE_PROVIDER_SITE_OTHER): Payer: Self-pay | Admitting: Surgery

## 2012-12-13 ENCOUNTER — Inpatient Hospital Stay (HOSPITAL_COMMUNITY)
Admission: AD | Admit: 2012-12-13 | Discharge: 2012-12-16 | DRG: 392 | Disposition: A | Discharge status: Home / Self Care | Payer: Medicaid Other | Source: Ambulatory Visit | Attending: General Surgery | Admitting: General Surgery

## 2012-12-13 ENCOUNTER — Ambulatory Visit (INDEPENDENT_AMBULATORY_CARE_PROVIDER_SITE_OTHER): Payer: Medicaid Other | Admitting: Surgery

## 2012-12-13 ENCOUNTER — Encounter (HOSPITAL_COMMUNITY): Payer: Self-pay | Admitting: *Deleted

## 2012-12-13 ENCOUNTER — Telehealth (INDEPENDENT_AMBULATORY_CARE_PROVIDER_SITE_OTHER): Payer: Self-pay

## 2012-12-13 VITALS — BP 138/82 | HR 74 | Temp 97.6°F | Resp 14 | Ht 62.0 in | Wt 242.0 lb

## 2012-12-13 DIAGNOSIS — Z83719 Family history of colon polyps, unspecified: Secondary | ICD-10-CM

## 2012-12-13 DIAGNOSIS — Z6841 Body Mass Index (BMI) 40.0 and over, adult: Secondary | ICD-10-CM

## 2012-12-13 DIAGNOSIS — K5732 Diverticulitis of large intestine without perforation or abscess without bleeding: Secondary | ICD-10-CM

## 2012-12-13 DIAGNOSIS — E669 Obesity, unspecified: Secondary | ICD-10-CM | POA: Diagnosis present

## 2012-12-13 DIAGNOSIS — K5792 Diverticulitis of intestine, part unspecified, without perforation or abscess without bleeding: Secondary | ICD-10-CM | POA: Diagnosis present

## 2012-12-13 DIAGNOSIS — Z807 Family history of other malignant neoplasms of lymphoid, hematopoietic and related tissues: Secondary | ICD-10-CM

## 2012-12-13 DIAGNOSIS — R112 Nausea with vomiting, unspecified: Secondary | ICD-10-CM | POA: Diagnosis present

## 2012-12-13 DIAGNOSIS — Z8041 Family history of malignant neoplasm of ovary: Secondary | ICD-10-CM

## 2012-12-13 DIAGNOSIS — Z8371 Family history of colonic polyps: Secondary | ICD-10-CM

## 2012-12-13 DIAGNOSIS — Z85038 Personal history of other malignant neoplasm of large intestine: Secondary | ICD-10-CM

## 2012-12-13 LAB — CBC
HCT: 37 % (ref 36.0–46.0)
Hemoglobin: 12.6 g/dL (ref 12.0–15.0)
MCH: 28.3 pg (ref 26.0–34.0)
MCV: 83.1 fL (ref 78.0–100.0)
Platelets: 233 10*3/uL (ref 150–400)
RBC: 4.45 MIL/uL (ref 3.87–5.11)
WBC: 6.6 10*3/uL (ref 4.0–10.5)

## 2012-12-13 LAB — CREATININE, SERUM
Creatinine, Ser: 0.7 mg/dL (ref 0.50–1.10)
GFR calc Af Amer: >90 mL/min (ref >90)

## 2012-12-13 MED ORDER — ACETAMINOPHEN 325 MG PO TABS
650.0000 mg | ORAL_TABLET | Freq: Four times a day (QID) | ORAL | Status: DC | PRN
  Administered 2012-12-14: 650 mg via ORAL
  Filled 2012-12-13: qty 2

## 2012-12-13 MED ORDER — ACETAMINOPHEN 650 MG RE SUPP: 650.0000 mg | Freq: Four times a day (QID) | RECTAL | Status: DC | PRN

## 2012-12-13 MED ORDER — ONDANSETRON HCL 4 MG/2ML IJ SOLN
4.0000 mg | Freq: Four times a day (QID) | INTRAMUSCULAR | Status: DC | PRN
  Administered 2012-12-14: 4 mg via INTRAVENOUS
  Filled 2012-12-13: qty 2

## 2012-12-13 MED ORDER — ENOXAPARIN SODIUM 40 MG/0.4ML ~~LOC~~ SOLN
40.0000 mg | SUBCUTANEOUS | Status: DC
  Administered 2012-12-13 – 2012-12-15 (×3): 40 mg via SUBCUTANEOUS
  Filled 2012-12-13 (×4): qty 0.4

## 2012-12-13 MED ORDER — PIPERACILLIN-TAZOBACTAM 3.375 G IVPB
3.3750 g | Freq: Three times a day (TID) | INTRAVENOUS | Status: DC
  Administered 2012-12-13 – 2012-12-15 (×5): 3.375 g via INTRAVENOUS
  Filled 2012-12-13 (×6): qty 50

## 2012-12-13 MED ORDER — HYDROMORPHONE HCL PF 1 MG/ML IJ SOLN
1.0000 mg | INTRAMUSCULAR | Status: DC | PRN
  Administered 2012-12-13 – 2012-12-15 (×10): 1 mg via INTRAVENOUS
  Filled 2012-12-13 (×10): qty 1

## 2012-12-13 MED ORDER — DEXTROSE IN LACTATED RINGERS 5 % IV SOLN
INTRAVENOUS | Status: DC
  Administered 2012-12-13 – 2012-12-15 (×4): via INTRAVENOUS

## 2012-12-13 NOTE — Telephone Encounter (Signed)
I notified the pt, per Dr Derrell Lolling, to take the abx the ER put her on.  She should stay on a liquid diet.  Have her come in to urgent office today and get checked.  I made an appointment for her to come in at 4 and arrive at 3:45 to check in and see Dr Luisa Hart.

## 2012-12-13 NOTE — Progress Notes (Signed)
Patient ID: Lauren Norris, female   DOB: 20-Apr-1965, 47 y.o.   MRN: [id number]  Chief Complaint  Patient presents with  . Routine Post Op    urg/ pt seen in ED for diverticulitis    HPI Lauren Norris is a 47 y.o. female.  Pt presents due to LLQ abdominal pain. Seen yesterday in ED and DX with diverticulitis.  Sent home.  Continues to have LLQ pain despite ABX and feels worse.   No N/V or diarrhea. Pain 8/10 LOCALIZED LLQ. HPI  Past Medical History  Diagnosis Date  . Fatigue   . Migraine   . Palpitations   . Hot flashes     VIVELLE DOT PATCH HAS HELPED STOP HOT FLASHES  . Bulging disc     LUMBAR - LOWER BACK PAIN AND NUMBNES DOWN RIGHT LEG  . Mass of colon     NEOPLASTIC MASS TRANSVERSE COLON - FOUND ON ROUTINE COLONSCOPY SCREENING  . Colon cancer 2014    transverse colon    Past Surgical History  Procedure Laterality Date  . Dilation and curettage of uterus    . Cesarean section  07/12/1998  . Laparoscopic partial colectomy N/A 09/07/2012    Procedure: LAPAROSCOPIC ASSISTED TRANSVERSE COLECTOMY;  Surgeon: Ernestene Mention, MD;  Location: [facility];  Service: General;  Laterality: N/A;    Family History  Problem Relation Age of Onset  . Cancer - Colon Paternal Grandmother 43  . Cancer - Colon Father 59  . Multiple myeloma Maternal Aunt     dx between 50-60  . Cancer - Lung Paternal Grandfather 66  . Cancer - Colon Paternal Uncle 75  . Ovarian cancer Maternal Grandmother     dx in her 58s  . Colon polyps Brother 90  . Colon polyps Sister 6  . Cancer - Lung Maternal Uncle     smoker  . Cancer - Colon Paternal Aunt     dx in her 58s-50s  . Cancer - Colon Other     paternal great aunt (grandmother's sister) dx between 35-70  . Cancer - Colon Other     paternal grandmother's mother    Social History History  Substance Use Topics  . Smoking status: Never Smoker   . Smokeless tobacco: Never Used  . Alcohol Use: Yes     Comment: 2-3 times week    No Known  Allergies  No current outpatient prescriptions on file.   No current facility-administered medications for this visit.    Review of Systems Review of Systems  HENT: Negative.   Gastrointestinal: Positive for abdominal pain. Negative for nausea, diarrhea and constipation.  Genitourinary: Positive for dysuria.  Musculoskeletal: Negative.   Skin: Negative.     Blood pressure 138/82, pulse 74, temperature 97.6 F (36.4 C), temperature source Temporal, resp. rate 14, height 5\' 2"  (1.575 m), weight 242 lb (109.77 kg).  Physical Exam Physical Exam  Constitutional: She is oriented to person, place, and time.  HENT:  Head: Normocephalic and atraumatic.  Pulmonary/Chest: Effort normal.  Abdominal: There is tenderness in the left lower quadrant. There is no rigidity, no rebound and no guarding.    Musculoskeletal: Normal range of motion.  Neurological: She is alert and oriented to person, place, and time.    Data Reviewed CT A/P  12/12/2012  Acute diverticulitis without abscess.    Assessment    Acute diverticulitis     Plan    Admit for IVF/ABX and bowel rest.  D/W Dr Derrell Lolling.  Lauren Gerhart A. 12/13/2012, 5:25 PM

## 2012-12-13 NOTE — Telephone Encounter (Signed)
Pt calling in stating that she was seen in the ED yesterday for diverticulitis.  Pt asked for appointment with Dr. Derrell Lolling for either today or tomorrow.  Explained to pt that Dr. Derrell Lolling is at the hospital all this week, and that the first available I could put her in would be Dec 10.  I also let her know that I would send a message to [place] [facility] to see if she can squeeze pt in any sooner.  Pt asked "What am I suppose to do about the pain until then?"  I asked if the ED gave her any pain meds.  Pt stated that she has 6 left, and that those might get her through till tomorrow.  Again explained that I will send message to [place] Dec and Dr. Derrell Lolling to see if he would OK another doctor to write Rx for pain medication.

## 2012-12-13 NOTE — H&P (Signed)
Patient presents with    .  Routine Post Op      urg/ pt seen in ED for diverticulitis     HPI  Lauren Norris is a 47 y.o. female. Pt presents due to LLQ abdominal pain. Seen yesterday in ED and DX with diverticulitis. Sent home. Continues to have LLQ pain despite ABX and feels worse. No N/V or diarrhea. Pain 8/10 LOCALIZED LLQ.  HPI    Past Medical History    Diagnosis  Date    .  Fatigue     .  Migraine     .  Palpitations     .  Hot flashes       VIVELLE DOT PATCH HAS HELPED STOP HOT FLASHES    .  Bulging disc       LUMBAR - LOWER BACK PAIN AND NUMBNES DOWN RIGHT LEG    .  Mass of colon       NEOPLASTIC MASS TRANSVERSE COLON - FOUND ON ROUTINE COLONSCOPY SCREENING    .  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  Dilation and curettage of uterus      .  Cesarean section   07/12/1998    .  Laparoscopic partial colectomy  N/A  09/07/2012      Procedure: LAPAROSCOPIC ASSISTED TRANSVERSE COLECTOMY; Surgeon: Ernestene Mention, MD; Location: [facility]; Service: General; Laterality: N/A;       Family History    Problem  Relation  Age of Onset    .  Cancer - Colon  Paternal Grandmother  61    .  Cancer - Colon  Father  73    .  Multiple myeloma  Maternal Aunt       dx between 50-60    .  Cancer - Lung  Paternal Grandfather  56    .  Cancer - Colon  Paternal Uncle  8    .  Ovarian cancer  Maternal Grandmother       dx in her 47s    .  Colon polyps  Brother  63    .  Colon polyps  Sister  45    .  Cancer - Lung  Maternal Uncle       smoker    .  Cancer - Colon  Paternal Aunt       dx in her 83s-50s    .  Cancer - Colon  Other       paternal great aunt (grandmother's sister) dx between 37-70    .  Cancer - Colon  Other       paternal grandmother's mother     Social History    History    Substance Use Topics    .  Smoking status:  Never Smoker    .  Smokeless tobacco:  Never Used    .  Alcohol Use:  Yes     Comment: 2-3 times week     No Known Allergies     No current outpatient prescriptions on file.     No current facility-administered medications for this visit.      Review of Systems  Review of Systems  HENT: Negative.  Gastrointestinal: Positive for abdominal pain. Negative for nausea, diarrhea and constipation.  Genitourinary: Positive for dysuria.  Musculoskeletal: Negative.  Skin: Negative.   Blood pressure 138/82, pulse  74, temperature 97.6 F (36.4 C), temperature source Temporal, resp. rate 14, height 5\' 2"  (1.575 m), weight 242 lb (109.77 kg).  Physical Exam  Physical Exam  Constitutional: She is oriented to person, place, and time.  HENT:  Head: Normocephalic and atraumatic.  Pulmonary/Chest: Effort normal.  Abdominal: There is tenderness in the left lower quadrant. There is no rigidity, no rebound and no guarding.    Musculoskeletal: Normal range of motion.  Neurological: She is alert and oriented to person, place, and time.   Data Reviewed  CT A/P 12/12/2012 Acute diverticulitis without abscess.  Assessment   Acute diverticulitis   Plan   Admit for IVF/ABX and bowel rest. D/W Dr Derrell Lolling.   Harriette Bouillon A.  12/13/2012, 5:25 PM

## 2012-12-13 NOTE — Patient Instructions (Signed)
Will admit for pain control and antibiotics.

## 2012-12-14 ENCOUNTER — Telehealth: Payer: Self-pay | Admitting: Genetic Counselor

## 2012-12-14 DIAGNOSIS — K5732 Diverticulitis of large intestine without perforation or abscess without bleeding: Secondary | ICD-10-CM

## 2012-12-14 LAB — COMPREHENSIVE METABOLIC PANEL
ALT: 43 U/L — ABNORMAL HIGH (ref 0–35)
AST: 24 U/L (ref 0–37)
Albumin: 3.5 g/dL (ref 3.5–5.2)
BUN: 11 mg/dL (ref 6–23)
Calcium: 9 mg/dL (ref 8.4–10.5)
Chloride: 101 mEq/L (ref 96–112)
Creatinine, Ser: 0.7 mg/dL (ref 0.50–1.10)
GFR calc non Af Amer: >90 mL/min (ref >90)
Total Bilirubin: 0.7 mg/dL (ref 0.3–1.2)

## 2012-12-14 LAB — CBC
HCT: 37.2 % (ref 36.0–46.0)
Hemoglobin: 12.3 g/dL (ref 12.0–15.0)
MCH: 27.5 pg (ref 26.0–34.0)
MCV: 83 fL (ref 78.0–100.0)
Platelets: 239 10*3/uL (ref 150–400)
RDW: 13.4 % (ref 11.5–15.5)
WBC: 6.5 10*3/uL (ref 4.0–10.5)

## 2012-12-14 NOTE — Care Management Note (Signed)
    Page 1 of 1   12/14/2012     10:55:34 AM   CARE MANAGEMENT NOTE 12/14/2012  Patient:  [facility]   Account Number:  [id number]  Date Initiated:  12/14/2012  Documentation initiated by:  Lorenda Ishihara  Subjective/Objective Assessment:   47 yo female admitted with diverticulitis. PTA lived at home with spouse.     Action/Plan:   Home when stable   Anticipated DC Date:  12/17/2012   Anticipated DC Plan:  HOME/SELF CARE      DC Planning Services  CM consult      Choice offered to / List presented to:             Status of service:  Completed, signed off Medicare Important Message given?   (If response is "NO", the following Medicare IM given date fields will be blank) Date Medicare IM given:   Date Additional Medicare IM given:    Discharge Disposition:  HOME/SELF CARE  Per UR Regulation:  Reviewed for med. necessity/level of care/duration of stay  If discussed at Long Length of Stay Meetings, dates discussed:    Comments:

## 2012-12-14 NOTE — ED Provider Notes (Signed)
Medical screening examination/treatment/procedure(s) were performed by non-physician practitioner and as supervising physician I was immediately available for consultation/collaboration.  Tocarra Gassen T Nyelli Samara, MD 12/14/12 0944 

## 2012-12-14 NOTE — Telephone Encounter (Signed)
Left good news message on VM and asked that she call back. 

## 2012-12-14 NOTE — Progress Notes (Signed)
Subjective: Left lower quadrant pain a little better this morning. Says she was nauseated and vomited a little last night.  She's had lower abdominal and left lower quadrant pain for 4 or 5 days. No alteration of bowel habits.No fever or chills. No prior history of diverticulitis. Lab work unremarkable. CT scan shows subtle changes around the colon. Heart detail whether this is diverticulitis or just an infarcted appendix epiploica.  Objective: Vital signs in last 24 hours: Temp:  [97.6 F (36.4 C)-97.9 F (36.6 C)] 97.9 F (36.6 C) (11/25 2200) Pulse Rate:  [74-87] 87 (11/25 2200) Resp:  [14-18] 18 (11/25 2200) BP: (127-152)/(82-95) 152/95 mmHg (11/25 2200) SpO2:  [97 %-98 %] 98 % (11/25 2200) Weight:  [242 lb (109.77 kg)] 242 lb (109.77 kg) (11/26 0303) Last BM Date: 12/12/12  Intake/Output from previous day: 11/25 0701 - 11/26 0700 In: 928.3 [I.V.:928.3] Out: -  Intake/Output this shift: Total I/O In: 928.3 [I.V.:928.3] Out: -    EXAM: General appearance: alert and cooperative. Appears tired but in no acute distress. Certainly no evidence of toxicity. Afebrile. VSS. GI: abdomen is obese but soft, nondistended. Minimal subjective tenderness left lower quadrant. No guarding. No mass. No hernia. Healed midline incision from previous colectomy.  Lab Results:   Recent Labs  12/13/12 1903 12/14/12 0430  WBC 6.6 6.5  HGB 12.6 12.3  HCT 37.0 37.2  PLT 233 239   BMET  Recent Labs  12/12/12 1552 12/13/12 1903 12/14/12 0430  NA 137  --  137  K 3.5  --  3.9  CL 103  --  101  CO2 26  --  26  GLUCOSE 103*  --  137*  BUN 11  --  11  CREATININE 0.60 0.70 0.70  CALCIUM 9.2  --  9.0   PT/INR No results found for this basename: LABPROT, INR,  in the last 72 hours ABG No results found for this basename: PHART, PCO2, PO2, HCO3,  in the last 72 hours  Studies/Results: Ct Abdomen Pelvis W Contrast  12/12/2012   CLINICAL DATA:  UTI, dysuria  EXAM: CT ABDOMEN AND  PELVIS WITH CONTRAST  TECHNIQUE: Multidetector CT imaging of the abdomen and pelvis was performed using the standard protocol following bolus administration of intravenous contrast.  CONTRAST:  50mL OMNIPAQUE IOHEXOL 300 MG/ML SOLN, OMNIPAQUE IOHEXOL 300 MG/ML SOLN  COMPARISON:  08/24/2012  FINDINGS: Sagittal images of the spine shows mild degenerative changes lower thoracic spine. Mild hepatic fatty infiltration. Lung bases are unremarkable. No calcified gallstones are noted within gallbladder. The pancreas, spleen and adrenal glands are unremarkable. Kidneys are symmetrical in size and enhancement. Delayed renal images shows bilateral renal symmetrical excretion. There is mild fullness of left renal collecting system without frank hydronephrosis. Mild distension of left ureter.  No aortic aneurysm. No small bowel obstruction. No free abdominal air. No abdominal ascites. The patient is status post right hemicolectomy. Some stool noted in distal colon. No small bowel or colonic obstruction.  Scattered diverticula are noted sigmoid colon. In axial image 74 there is mild stranding of pericolonic fat in mid sigmoid colon in left pelvis. Small amount of fluid is noted at this level. Findings are highly suspicious for diverticulitis or focal mesenteric fat inflammation. No pericolonic abscess is noted. There is mild prominence of the left ureter at this level. Inflammatory changes within left ureter cannot be excluded probable from adjacent pericolonic inflammation. The uterus is hyper anteflexed. Mild distended urinary bladder.  IMPRESSION: 1. Scattered diverticula are noted  sigmoid colon. There is mild stranding of pericolonic fat in mid sigmoid colon in left pelvis. Small amount of pericolonic fluid. Findings highly suspicious for diverticulitis or focal mesenteric panniculitis. No pericolonic abscess. Mild prominence of left ureter at this level may be due to satellite inflammatory changes. 2. Mild distended  urinary bladder. 3. Hyper flexed uterus. 4. Status post right hemicolectomy. Critical Value/emergent results were called by telephone at the time of interpretation on 12/12/2012 at 5:03 PM to Dr.ROBERT BROWNING , who verbally acknowledged these results.   Electronically Signed   By: Natasha Mead M.D.   On: 12/12/2012 17:04    Anti-infectives: Anti-infectives   Start     Dose/Rate Route Frequency Ordered Stop   12/13/12 2200  piperacillin-tazobactam (ZOSYN) IVPB 3.375 g     3.375 g 12.5 mL/hr over 240 Minutes Intravenous 3 times per day 12/13/12 1803        Assessment/Plan:  Presumed uncomplicated sigmoid diverticulitis. Failed outpatient management due to pain. Clinical and radiographic findings very subtle. Mobilize and ambulate Clear liquid diet as tolerated IV Zosyn  DVT prophylaxis. On Lovenox  History transverse colectomy for stage I colon cancer,  09/07/2012  obesity   LOS: 1 day    Kenyetta Fife M. Derrell Lolling, M.D., [facility] General and Minimally invasive Surgery Breast and Colorectal Surgery Office:   [phone] Pager:   [phone]  12/14/2012

## 2012-12-15 LAB — CBC
Hemoglobin: 12 g/dL (ref 12.0–15.0)
MCH: 27.6 pg (ref 26.0–34.0)
MCV: 83.4 fL (ref 78.0–100.0)
Platelets: 237 10*3/uL (ref 150–400)
RBC: 4.35 MIL/uL (ref 3.87–5.11)
RDW: 13.3 % (ref 11.5–15.5)
WBC: 5 10*3/uL (ref 4.0–10.5)

## 2012-12-15 LAB — BASIC METABOLIC PANEL
CO2: 27 mEq/L (ref 19–32)
Calcium: 8.9 mg/dL (ref 8.4–10.5)
Chloride: 101 mEq/L (ref 96–112)
GFR calc Af Amer: >90 mL/min (ref >90)
Potassium: 3.6 mEq/L (ref 3.5–5.1)

## 2012-12-15 MED ORDER — AMOXICILLIN-POT CLAVULANATE 875-125 MG PO TABS
1.0000 | ORAL_TABLET | Freq: Two times a day (BID) | ORAL | Status: DC
  Administered 2012-12-15 (×2): 1 via ORAL
  Filled 2012-12-15 (×4): qty 1

## 2012-12-15 NOTE — Progress Notes (Signed)
  Subjective: Continues to feel better.  Tolerating clears. No nausea  Objective: Vital signs in last 24 hours: Temp:  [97.4 F (36.3 C)-98.3 F (36.8 C)] 97.4 F (36.3 C) (11/27 0602) Pulse Rate:  [73-80] 73 (11/27 0602) Resp:  [18] 18 (11/27 0602) BP: (126-129)/(82-87) 128/85 mmHg (11/27 0602) SpO2:  [95 %-98 %] 97 % (11/27 0602) Last BM Date: 12/12/12  Intake/Output from previous day: 11/26 0701 - 11/27 0700 In: 3136.7 [P.O.:1960; I.V.:1176.7] Out: 1900 [Urine:1900] Intake/Output this shift:    General appearance: alert, cooperative and no distress Resp: clear to auscultation bilaterally Cardio: regular rate and rhythm, S1, S2 normal, no murmur, click, rub or gallop GI: soft, NT on exam, ND, no peritoneal signs  Lab Results:   Recent Labs  12/14/12 0430 12/15/12 0515  WBC 6.5 5.0  HGB 12.3 12.0  HCT 37.2 36.3  PLT 239 237   BMET  Recent Labs  12/14/12 0430 12/15/12 0515  NA 137 136  K 3.9 3.6  CL 101 101  CO2 26 27  GLUCOSE 137* 114*  BUN 11 7  CREATININE 0.70 0.70  CALCIUM 9.0 8.9   PT/INR No results found for this basename: LABPROT, INR,  in the last 72 hours ABG No results found for this basename: PHART, PCO2, PO2, HCO3,  in the last 72 hours  Studies/Results: No results found.  Anti-infectives: Anti-infectives   Start     Dose/Rate Route Frequency Ordered Stop   12/15/12 1000  amoxicillin-clavulanate (AUGMENTIN) 875-125 MG per tablet 1 tablet     1 tablet Oral Every 12 hours 12/15/12 0813     12/13/12 2200  piperacillin-tazobactam (ZOSYN) IVPB 3.375 g  Status:  Discontinued     3.375 g 12.5 mL/hr over 240 Minutes Intravenous 3 times per day 12/13/12 1803 12/15/12 0813      Assessment/Plan: s/p * No surgery found * she looks fine and continues to improve.  her abdomen is soft and NT, will advance diet as tolerated and plan to convert to oral abx.  plan for discharge tomorrow if she continues to improve.  LOS: 2 days    Lauren Norris DAVID 12/15/2012

## 2012-12-16 ENCOUNTER — Telehealth: Payer: Self-pay | Admitting: Genetic Counselor

## 2012-12-16 MED ORDER — AMOXICILLIN-POT CLAVULANATE 875-125 MG PO TABS: 1.0000 | ORAL_TABLET | Freq: Two times a day (BID) | ORAL | Status: DC

## 2012-12-16 NOTE — Progress Notes (Signed)
  Subjective: Abdominal pain resolved.  Tolerating diet. No fevers  Objective: Vital signs in last 24 hours: Temp:  [98.2 F (36.8 C)-98.6 F (37 C)] 98.2 F (36.8 C) (11/28 4098) Pulse Rate:  [72-86] 75 (11/28 0613) Resp:  [18] 18 (11/28 1191) BP: (127-130)/(76-87) 127/87 mmHg (11/28 0613) SpO2:  [98 %] 98 % (11/28 0613) Last BM Date: 12/15/12  Intake/Output from previous day: 11/27 0701 - 11/28 0700 In: 3608.3 [P.O.:1200; I.V.:2408.3] Out: 2950 [Urine:2950] Intake/Output this shift:    General appearance: alert, cooperative and no distress GI: soft, non-tender; bowel sounds normal; no masses,  no organomegaly  Lab Results:   Recent Labs  12/14/12 0430 12/15/12 0515  WBC 6.5 5.0  HGB 12.3 12.0  HCT 37.2 36.3  PLT 239 237   BMET  Recent Labs  12/14/12 0430 12/15/12 0515  NA 137 136  K 3.9 3.6  CL 101 101  CO2 26 27  GLUCOSE 137* 114*  BUN 11 7  CREATININE 0.70 0.70  CALCIUM 9.0 8.9   PT/INR No results found for this basename: LABPROT, INR,  in the last 72 hours ABG No results found for this basename: PHART, PCO2, PO2, HCO3,  in the last 72 hours  Studies/Results: No results found.  Anti-infectives: Anti-infectives   Start     Dose/Rate Route Frequency Ordered Stop   12/15/12 1200  amoxicillin-clavulanate (AUGMENTIN) 875-125 MG per tablet 1 tablet     1 tablet Oral Every 12 hours 12/15/12 0813     12/13/12 2200  piperacillin-tazobactam (ZOSYN) IVPB 3.375 g  Status:  Discontinued     3.375 g 12.5 mL/hr over 240 Minutes Intravenous 3 times per day 12/13/12 1803 12/15/12 0813      Assessment/Plan: s/p * No surgery found * tolerating diet and pain free, remains AF.  should be able to go home today on oral abx  LOS: 3 days    Lodema Pilot DAVID 12/16/2012

## 2012-12-16 NOTE — Progress Notes (Signed)
Patient and her husband given discharge instructions and they verbalized understanding.  Patient stable for discharge home.  Patient understands follow up with surgeon.  Allayne Butcher [facility]  12/16/2012  9:09 AM

## 2012-12-16 NOTE — Telephone Encounter (Signed)
Left good news message on VM.  3rd attempt.

## 2012-12-19 ENCOUNTER — Encounter: Payer: Self-pay | Admitting: Genetic Counselor

## 2013-01-02 ENCOUNTER — Telehealth (INDEPENDENT_AMBULATORY_CARE_PROVIDER_SITE_OTHER): Payer: Self-pay

## 2013-01-02 NOTE — Telephone Encounter (Signed)
Called patient to make aware that per Dr. Derrell Lolling she need's to be seen by GI for further work-up.

## 2013-01-02 NOTE — Telephone Encounter (Signed)
Per Dr Derrell Lolling have her see her GI physician.

## 2013-01-02 NOTE — Telephone Encounter (Signed)
Patient calling into office to report that she feel's as if her diverticulitis is flaring up.  Patient wasn't sure if she should be seen by Dr. Derrell Lolling or a GI physician.  Patient aware that I will forward message to Dr. Derrell Lolling and his RN Huntley Dec)  Please call @ [phone]

## 2013-01-06 ENCOUNTER — Other Ambulatory Visit: Payer: Self-pay | Admitting: Gastroenterology

## 2013-01-06 DIAGNOSIS — R109 Unspecified abdominal pain: Secondary | ICD-10-CM

## 2013-01-06 DIAGNOSIS — K5792 Diverticulitis of intestine, part unspecified, without perforation or abscess without bleeding: Secondary | ICD-10-CM

## 2013-01-09 ENCOUNTER — Ambulatory Visit
Admission: RE | Admit: 2013-01-09 | Discharge: 2013-01-09 | Disposition: A | Discharge status: Home / Self Care | Payer: Medicaid Other | Source: Ambulatory Visit | Attending: Gastroenterology | Admitting: Gastroenterology

## 2013-01-09 DIAGNOSIS — K5792 Diverticulitis of intestine, part unspecified, without perforation or abscess without bleeding: Secondary | ICD-10-CM

## 2013-01-09 DIAGNOSIS — R109 Unspecified abdominal pain: Secondary | ICD-10-CM

## 2013-01-09 MED ORDER — IOHEXOL 300 MG/ML  SOLN
125.0000 mL | Freq: Once | INTRAMUSCULAR | Status: AC | PRN
  Administered 2013-01-09: 125 mL via INTRAVENOUS

## 2013-01-16 ENCOUNTER — Telehealth: Payer: Self-pay | Admitting: Genetic Counselor

## 2013-01-16 NOTE — Telephone Encounter (Signed)
Left message that we had good news on her Lynch syndrome testing.

## 2013-01-17 ENCOUNTER — Telehealth: Payer: Self-pay | Admitting: Genetic Counselor

## 2013-01-17 NOTE — Telephone Encounter (Signed)
Revealed negative Lynch syndrome testing.  Discussed that this was not surprising as her MSI/IHC came back negative.  We recommend that once she obtains [employer], that we consider testing other hereditary cancer genes.  We also discussed that her father or paternal aunt/uncle should consider testing.  They can do that in [place] at [facility] or [facility].

## 2013-01-20 ENCOUNTER — Encounter: Payer: Self-pay | Admitting: Genetic Counselor

## 2013-01-27 NOTE — Discharge Summary (Signed)
Physician Discharge Summary  Patient ID: Lauren Norris MRN: [id number] DOB/AGE: [birth year] 48 y.o.  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 Started on Cipro and Flagyl. She was seen the following day in our clinic by Dr. Brantley Stage with LLQ pain and was admitted for in patient treatment.  She was place on IV antibiotics and improved after 48 hours.  She was transitioned to oral antibiotics and discharged home on oral antibiotics on 12/13/13.  Follow up in office as noted below. I did not see this patient.   Disposition: 01-Home or Self Care  Discharge Orders   Future Orders Complete By Expires   Call MD for:  persistant nausea and vomiting  As directed    Call MD for:  severe uncontrolled pain  As directed    Call MD for:  temperature >100.4  As directed    Diet - low sodium heart healthy  As directed    Discharge instructions  As directed    Comments:     Call [phone] to schedule follow up appointment with [facility] [place] [facility] in about 2 weeks Follow up with your primary care physician as soon as possible.   Increase activity slowly  As directed        Medication List    STOP taking these medications       ciprofloxacin 500 MG tablet  Commonly known as:  CIPRO     metroNIDAZOLE 500 MG tablet  Commonly known as:  FLAGYL      TAKE these medications       alprazolam 2 MG tablet  Commonly known as:  XANAX  Take 2 mg by mouth at bedtime as needed for sleep.     amoxicillin-clavulanate 875-125 MG per tablet  Commonly known as:  AUGMENTIN  Take 1 tablet by mouth every 12  (twelve) hours.     estradiol 0.1 MG/24HR patch  Commonly known as:  VIVELLE-DOT  Place 1 patch onto the skin 2 (two) times a week.     ibuprofen 200 MG tablet  Commonly known as:  ADVIL,MOTRIN  Take 400 mg by mouth every 6 (six) hours as needed for moderate pain.     oxyCODONE-acetaminophen 5-325 MG per tablet  Commonly known as:  PERCOCET/ROXICET  Take 2 tablets by mouth every 4 (four) hours as needed for severe pain.         SignedEarnstine Regal 01/27/2013, 1:52 PM

## 2013-01-27 NOTE — Discharge Summary (Signed)
Note reviewed and cosigned.  Edsel Petrin. Dalbert Batman, M.D., [facility] General and Minimally invasive Surgery Breast and Colorectal Surgery Office:   [phone] Pager:   [phone]

## 2013-07-26 ENCOUNTER — Emergency Department (HOSPITAL_COMMUNITY)
Admission: EM | Admit: 2013-07-26 | Discharge: 2013-07-26 | Disposition: A | Discharge status: Home / Self Care | Payer: No Typology Code available for payment source | Attending: Emergency Medicine | Admitting: Emergency Medicine

## 2013-07-26 ENCOUNTER — Encounter (HOSPITAL_COMMUNITY): Payer: Self-pay | Admitting: Emergency Medicine

## 2013-07-26 ENCOUNTER — Emergency Department (HOSPITAL_COMMUNITY): Payer: No Typology Code available for payment source

## 2013-07-26 DIAGNOSIS — Z8742 Personal history of other diseases of the female genital tract: Secondary | ICD-10-CM | POA: Insufficient documentation

## 2013-07-26 DIAGNOSIS — S199XXA Unspecified injury of neck, initial encounter: Secondary | ICD-10-CM | POA: Diagnosis not present

## 2013-07-26 DIAGNOSIS — Y92410 Unspecified street and highway as the place of occurrence of the external cause: Secondary | ICD-10-CM | POA: Insufficient documentation

## 2013-07-26 DIAGNOSIS — IMO0002 Reserved for concepts with insufficient information to code with codable children: Secondary | ICD-10-CM | POA: Insufficient documentation

## 2013-07-26 DIAGNOSIS — Y9389 Activity, other specified: Secondary | ICD-10-CM | POA: Insufficient documentation

## 2013-07-26 DIAGNOSIS — G8929 Other chronic pain: Secondary | ICD-10-CM | POA: Diagnosis not present

## 2013-07-26 DIAGNOSIS — S46909A Unspecified injury of unspecified muscle, fascia and tendon at shoulder and upper arm level, unspecified arm, initial encounter: Secondary | ICD-10-CM | POA: Diagnosis not present

## 2013-07-26 DIAGNOSIS — S0993XA Unspecified injury of face, initial encounter: Secondary | ICD-10-CM | POA: Diagnosis present

## 2013-07-26 DIAGNOSIS — Z8719 Personal history of other diseases of the digestive system: Secondary | ICD-10-CM | POA: Diagnosis not present

## 2013-07-26 DIAGNOSIS — Z85038 Personal history of other malignant neoplasm of large intestine: Secondary | ICD-10-CM | POA: Insufficient documentation

## 2013-07-26 DIAGNOSIS — Z8739 Personal history of other diseases of the musculoskeletal system and connective tissue: Secondary | ICD-10-CM | POA: Insufficient documentation

## 2013-07-26 DIAGNOSIS — I10 Essential (primary) hypertension: Secondary | ICD-10-CM

## 2013-07-26 DIAGNOSIS — R519 Headache, unspecified: Secondary | ICD-10-CM

## 2013-07-26 DIAGNOSIS — S0990XA Unspecified injury of head, initial encounter: Secondary | ICD-10-CM | POA: Diagnosis not present

## 2013-07-26 DIAGNOSIS — S4980XA Other specified injuries of shoulder and upper arm, unspecified arm, initial encounter: Secondary | ICD-10-CM | POA: Diagnosis not present

## 2013-07-26 DIAGNOSIS — M542 Cervicalgia: Secondary | ICD-10-CM

## 2013-07-26 DIAGNOSIS — R51 Headache: Secondary | ICD-10-CM

## 2013-07-26 MED ORDER — DEXAMETHASONE SODIUM PHOSPHATE 10 MG/ML IJ SOLN
10.0000 mg | Freq: Once | INTRAMUSCULAR | Status: AC
  Administered 2013-07-26: 10 mg via INTRAMUSCULAR
  Filled 2013-07-26: qty 1

## 2013-07-26 MED ORDER — MELOXICAM 7.5 MG PO TABS: ORAL_TABLET | ORAL | Status: DC

## 2013-07-26 MED ORDER — KETOROLAC TROMETHAMINE 30 MG/ML IJ SOLN
30.0000 mg | Freq: Once | INTRAMUSCULAR | Status: AC
  Administered 2013-07-26: 30 mg via INTRAMUSCULAR
  Filled 2013-07-26: qty 1

## 2013-07-26 MED ORDER — CYCLOBENZAPRINE HCL 5 MG PO TABS: 5.0000 mg | ORAL_TABLET | Freq: Three times a day (TID) | ORAL | Status: DC | PRN

## 2013-07-26 NOTE — Discharge Instructions (Signed)
Take Mobic for pain - take with food  Take flexeril for muscle spasm - take at night - Please be careful with this medication.  It can cause drowsiness.  Use caution while driving, operating machinery, drinking alcohol, or any other activities that may impair your physical or mental abilities.   Use RICE method - see below  Return to the emergency department if you develop any changing/worsening condition, weakness, severe headache, vision changes, confusion, loss of sensation, chest pain, difficulty breathing, abdominal pain, or any other concerns (please read additional information regarding your condition below)     General Headache Without Cause A headache is pain or discomfort felt around the head or neck area. The specific cause of a headache may not be found. There are many causes and types of headaches. A few common ones are:  Tension headaches.  Migraine headaches.  Cluster headaches.  Chronic daily headaches. HOME CARE INSTRUCTIONS   Keep all follow-up appointments with your caregiver or any specialist referral.  Only take over-the-counter or prescription medicines for pain or discomfort as directed by your caregiver.  Lie down in a dark, quiet room when you have a headache.  Keep a headache journal to find out what may trigger your migraine headaches. For example, write down:  What you eat and drink.  How much sleep you get.  Any change to your diet or medicines.  Try massage or other relaxation techniques.  Put ice packs or heat on the head and neck. Use these 3 to 4 times per day for 15 to 20 minutes each time, or as needed.  Limit stress.  Sit up straight, and do not tense your muscles.  Quit smoking if you smoke.  Limit alcohol use.  Decrease the amount of caffeine you drink, or stop drinking caffeine.  Eat and sleep on a regular schedule.  Get 7 to 9 hours of sleep, or as recommended by your caregiver.  Keep lights dim if bright lights bother you  and make your headaches worse. SEEK MEDICAL CARE IF:   You have problems with the medicines you were prescribed.  Your medicines are not working.  You have a change from the usual headache.  You have nausea or vomiting. SEEK IMMEDIATE MEDICAL CARE IF:   Your headache becomes severe.  You have a fever.  You have a stiff neck.  You have loss of vision.  You have muscular weakness or loss of muscle control.  You start losing your balance or have trouble walking.  You feel faint or pass out.  You have severe symptoms that are different from your first symptoms. MAKE SURE YOU:   Understand these instructions.  Will watch your condition.  Will get help right away if you are not doing well or get worse. Document Released: 01/05/2005 Document Revised: 03/30/2011 Document Reviewed: 01/21/2011 [facility] Patient Information 2015 [place], [place]. This information is not intended to replace advice given to you by your health care provider. Make sure you discuss any questions you have with your health care provider.   Cervical Sprain A cervical sprain is an injury in the neck in which the strong, fibrous tissues (ligaments) that connect your neck bones stretch or tear. Cervical sprains can range from mild to severe. Severe cervical sprains can cause the neck vertebrae to be unstable. This can lead to damage of the spinal cord and can result in serious nervous system problems. The amount of time it takes for a cervical sprain to get better depends on  the cause and extent of the injury. Most cervical sprains heal in 1 to 3 weeks. CAUSES  Severe cervical sprains may be caused by:   Contact sport injuries (such as from football, rugby, wrestling, hockey, auto racing, gymnastics, diving, martial arts, or boxing).   Motor vehicle collisions.   Whiplash injuries. This is an injury from a sudden forward and backward whipping movement of the head and neck.  Falls.  Mild cervical sprains  may be caused by:   Being in an awkward position, such as while cradling a telephone between your ear and shoulder.   Sitting in a chair that does not offer proper support.   Working at a poorly Landscape architect station.   Looking up or down for long periods of time.  SYMPTOMS   Pain, soreness, stiffness, or a burning sensation in the front, back, or sides of the neck. This discomfort may develop immediately after the injury or slowly, 24 hours or more after the injury.   Pain or tenderness directly in the middle of the back of the neck.   Shoulder or upper back pain.   Limited ability to move the neck.   Headache.   Dizziness.   Weakness, numbness, or tingling in the hands or arms.   Muscle spasms.   Difficulty swallowing or chewing.   Tenderness and swelling of the neck.  DIAGNOSIS  Most of the time your health care provider can diagnose a cervical sprain by taking your history and doing a physical exam. Your health care provider will ask about previous neck injuries and any known neck problems, such as arthritis in the neck. X-rays may be taken to find out if there are any other problems, such as with the bones of the neck. Other tests, such as a CT scan or MRI, may also be needed.  TREATMENT  Treatment depends on the severity of the cervical sprain. Mild sprains can be treated with rest, keeping the neck in place (immobilization), and pain medicines. Severe cervical sprains are immediately immobilized. Further treatment is done to help with pain, muscle spasms, and other symptoms and may include:  Medicines, such as pain relievers, numbing medicines, or muscle relaxants.   Physical therapy. This may involve stretching exercises, strengthening exercises, and posture training. Exercises and improved posture can help stabilize the neck, strengthen muscles, and help stop symptoms from returning.  HOME CARE INSTRUCTIONS   Put ice on the injured area.   Put  ice in a plastic bag.   Place a towel between your skin and the bag.   Leave the ice on for 15-20 minutes, 3-4 times a day.   If your injury was severe, you may have been given a cervical collar to wear. A cervical collar is a two-piece collar designed to keep your neck from moving while it heals.  Do not remove the collar unless instructed by your health care provider.  If you have long hair, keep it outside of the collar.  Ask your health care provider before making any adjustments to your collar. Minor adjustments may be required over time to improve comfort and reduce pressure on your chin or on the back of your head.  Ifyou are allowed to remove the collar for cleaning or bathing, follow your health care provider's instructions on how to do so safely.  Keep your collar clean by wiping it with mild soap and water and drying it completely. If the collar you have been given includes removable pads, remove them every  1-2 days and hand wash them with soap and water. Allow them to air dry. They should be completely dry before you wear them in the collar.  If you are allowed to remove the collar for cleaning and bathing, wash and dry the skin of your neck. Check your skin for irritation or sores. If you see any, tell your health care provider.  Do not drive while wearing the collar.   Only take over-the-counter or prescription medicines for pain, discomfort, or fever as directed by your health care provider.   Keep all follow-up appointments as directed by your health care provider.   Keep all physical therapy appointments as directed by your health care provider.   Make any needed adjustments to your workstation to promote good posture.   Avoid positions and activities that make your symptoms worse.   Warm up and stretch before being active to help prevent problems.  SEEK MEDICAL CARE IF:   Your pain is not controlled with medicine.   You are unable to decrease your  pain medicine over time as planned.   Your activity level is not improving as expected.  SEEK IMMEDIATE MEDICAL CARE IF:   You develop any bleeding.  You develop stomach upset.  You have signs of an allergic reaction to your medicine.   Your symptoms get worse.   You develop new, unexplained symptoms.   You have numbness, tingling, weakness, or paralysis in any part of your body.  MAKE SURE YOU:   Understand these instructions.  Will watch your condition.  Will get help right away if you are not doing well or get worse. Document Released: 11/02/2006 Document Revised: 01/10/2013 Document Reviewed: 07/13/2012 [facility] Patient Information 2015 [place], [place]. This information is not intended to replace advice given to you by your health care provider. Make sure you discuss any questions you have with your health care provider.  Motor Vehicle Collision  It is common to have multiple bruises and sore muscles after a motor vehicle collision (MVC). These tend to feel worse for the first 24 hours. You may have the most stiffness and soreness over the first several hours. You may also feel worse when you wake up the first morning after your collision. After this point, you will usually begin to improve with each day. The speed of improvement often depends on the severity of the collision, the number of injuries, and the location and nature of these injuries. HOME CARE INSTRUCTIONS   Put ice on the injured area.  Put ice in a plastic bag.  Place a towel between your skin and the bag.  Leave the ice on for 15-20 minutes, 3-4 times a day, or as directed by your health care provider.  Drink enough fluids to keep your urine clear or pale yellow. Do not drink alcohol.  Take a warm shower or bath once or twice a day. This will increase blood flow to sore muscles.  You may return to activities as directed by your caregiver. Be careful when lifting, as this may aggravate neck or back  pain.  Only take over-the-counter or prescription medicines for pain, discomfort, or fever as directed by your caregiver. Do not use aspirin. This may increase bruising and bleeding. SEEK IMMEDIATE MEDICAL CARE IF:  You have numbness, tingling, or weakness in the arms or legs.  You develop severe headaches not relieved with medicine.  You have severe neck pain, especially tenderness in the middle of the back of your neck.  You have  changes in bowel or bladder control.  There is increasing pain in any area of the body.  You have shortness of breath, lightheadedness, dizziness, or fainting.  You have chest pain.  You feel sick to your stomach (nauseous), throw up (vomit), or sweat.  You have increasing abdominal discomfort.  There is blood in your urine, stool, or vomit.  You have pain in your shoulder (shoulder strap areas).  You feel your symptoms are getting worse. MAKE SURE YOU:   Understand these instructions.  Will watch your condition.  Will get help right away if you are not doing well or get worse. Document Released: 01/05/2005 Document Revised: 01/10/2013 Document Reviewed: 06/04/2010 [facility] Patient Information 2015 [place], [place]. This information is not intended to replace advice given to you by your health care provider. Make sure you discuss any questions you have with your health care provider.
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
## 2013-07-29 NOTE — ED Provider Notes (Signed)
Medical screening examination/treatment/procedure(s) were performed by non-physician practitioner and as supervising physician I was immediately available for consultation/collaboration.   EKG Interpretation None       Lauren Norris. Alvino Chapel, MD 07/29/13 [phone]

## 2013-08-04 ENCOUNTER — Other Ambulatory Visit: Payer: Self-pay | Admitting: Obstetrics and Gynecology

## 2013-08-22 ENCOUNTER — Encounter (INDEPENDENT_AMBULATORY_CARE_PROVIDER_SITE_OTHER): Payer: Self-pay | Admitting: General Surgery

## 2013-08-30 ENCOUNTER — Other Ambulatory Visit: Payer: Self-pay | Admitting: Gastroenterology

## 2013-09-20 ENCOUNTER — Encounter (HOSPITAL_COMMUNITY): Payer: Self-pay

## 2013-10-23 ENCOUNTER — Other Ambulatory Visit (INDEPENDENT_AMBULATORY_CARE_PROVIDER_SITE_OTHER): Payer: Self-pay | Admitting: General Surgery

## 2013-11-20 ENCOUNTER — Encounter (HOSPITAL_COMMUNITY): Payer: Self-pay | Admitting: Emergency Medicine

## 2014-05-18 IMAGING — CT CT ABD-PELV W/ CM
1 of 3 series · 13 of 32 positions shown, 18 images · IV contrast (OMNIPAQUE 300)
Comparison: 08/24/2012

CLINICAL DATA: UTI, dysuria

EXAM:
CT ABDOMEN AND PELVIS WITH CONTRAST
TECHNIQUE: Multidetector CT imaging of the abdomen and pelvis was performed
using the standard protocol following bolus administration of
intravenous contrast.
CONTRAST:  50mL OMNIPAQUE IOHEXOL 300 MG/ML SOLN, 100mL OMNIPAQUE
IOHEXOL 300 MG/ML SOLN

[Series 2: abd/pel with · axial · 0.74mm/px · z∈[+1142,+1567]mm · 13 of 97 slices shown, 18 images]
[im 6/97  soft-tissue]
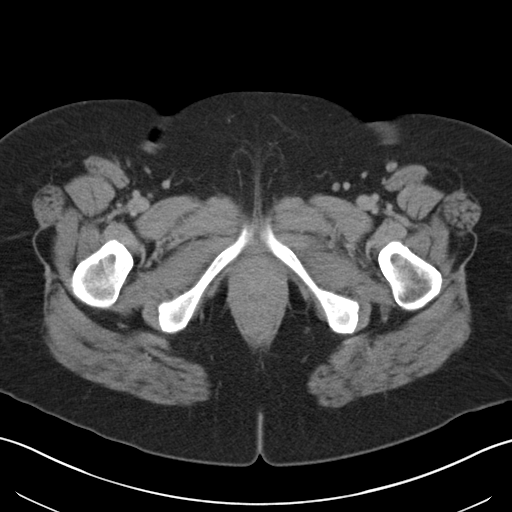
[im 6/97  bone]
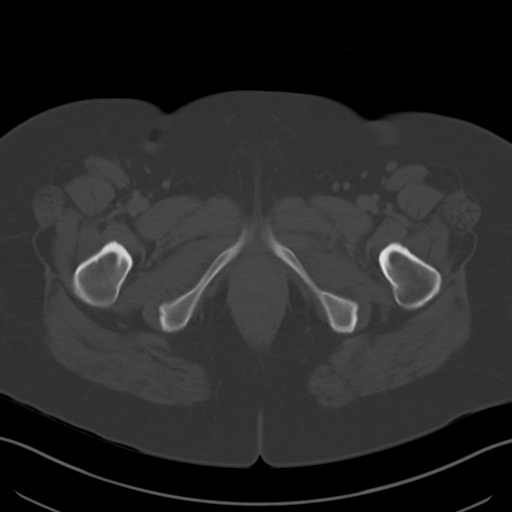
[im 16/97  soft-tissue]
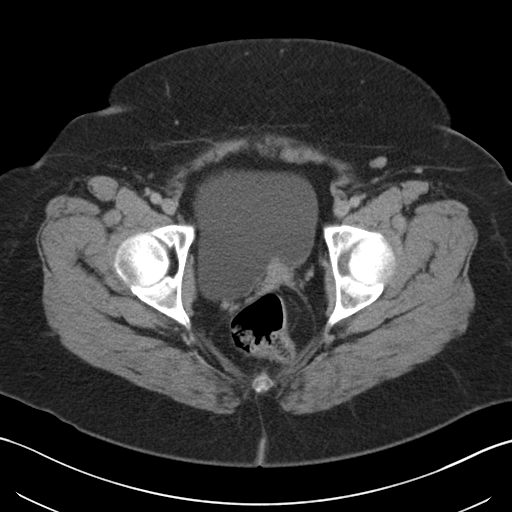
[im 21/97  soft-tissue]
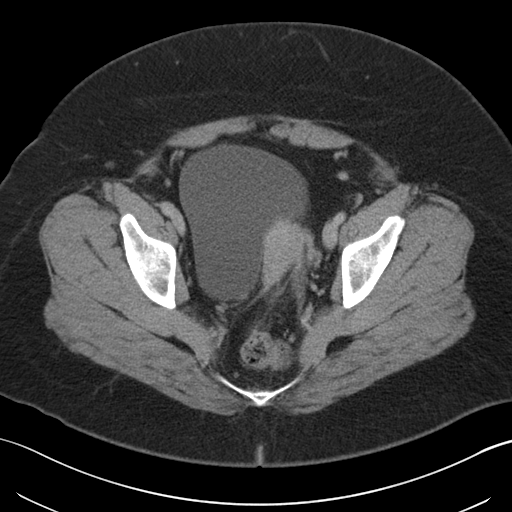
[im 31/97  soft-tissue]
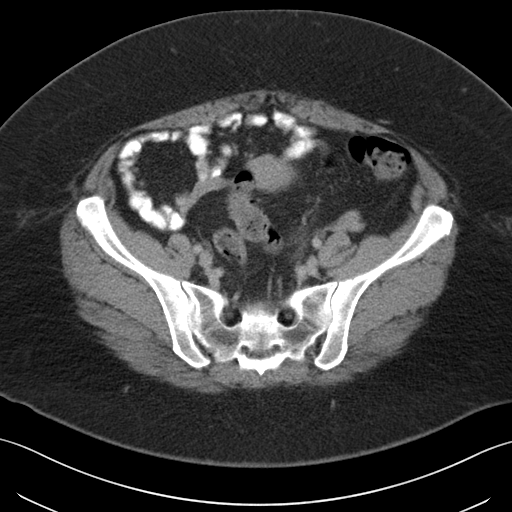
[im 36/97  soft-tissue]
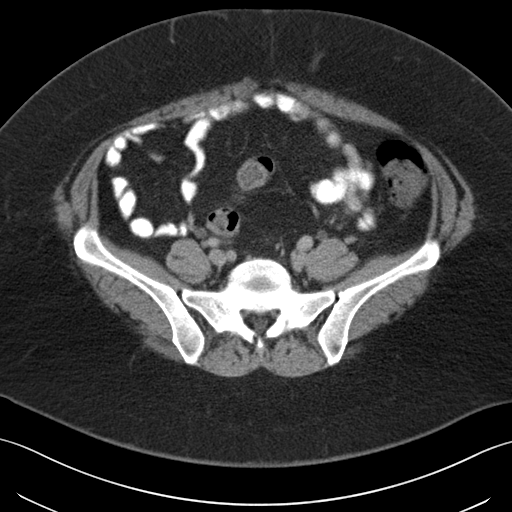
[im 46/97  soft-tissue]
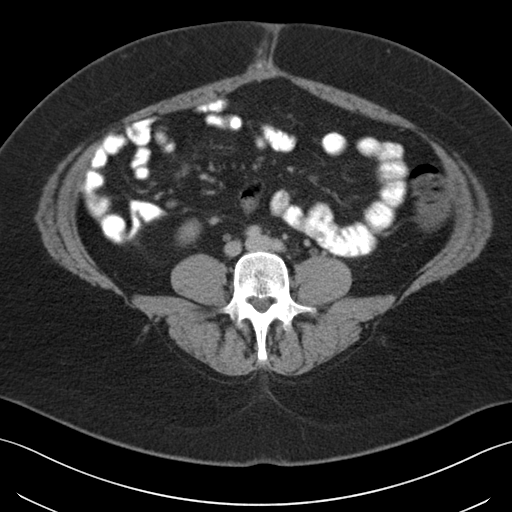
[im 51/97  soft-tissue]
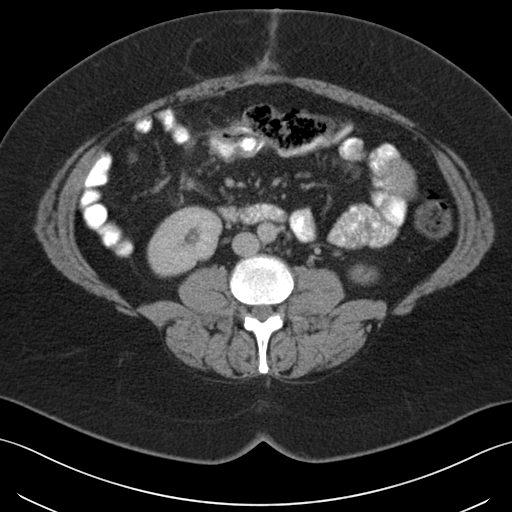
[im 61/97  soft-tissue]
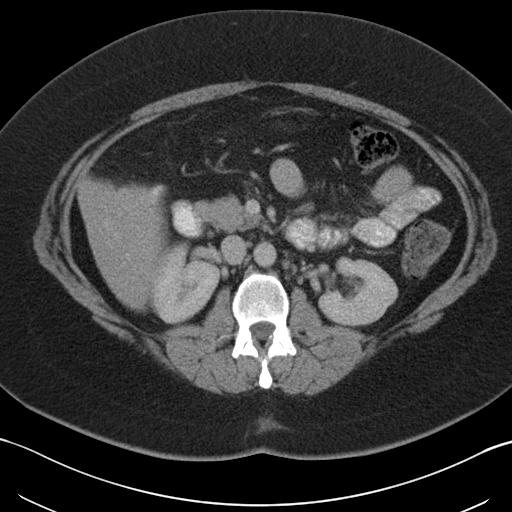
[im 66/97  soft-tissue]
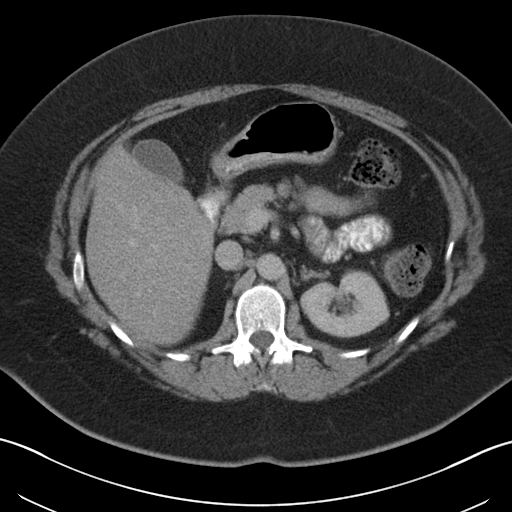
[im 66/97  bone]
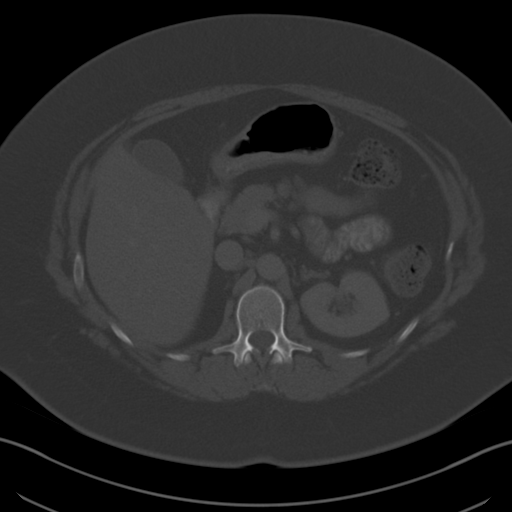
[im 76/97  soft-tissue]
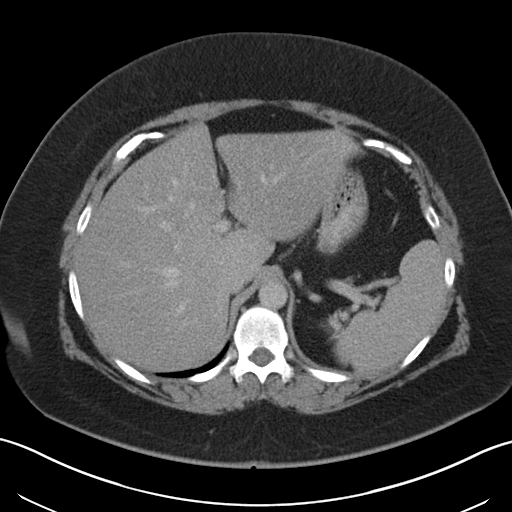
[im 76/97  lung]
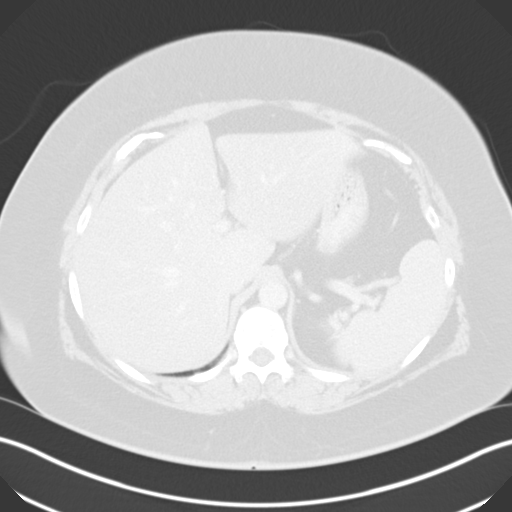
[im 81/97  soft-tissue]
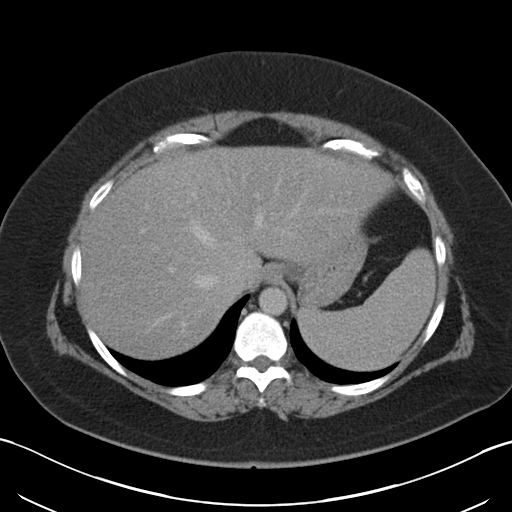
[im 81/97  lung]
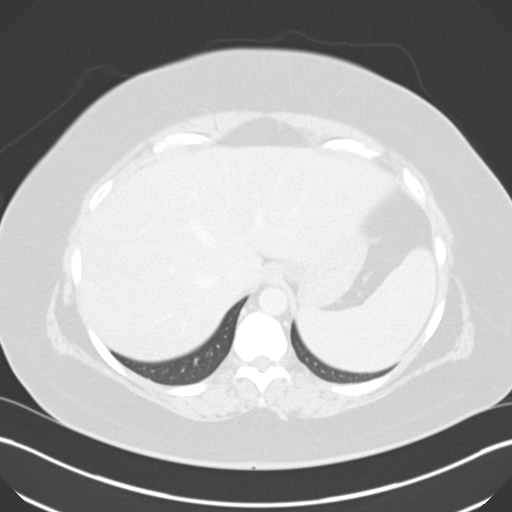
[im 86/97  lung]
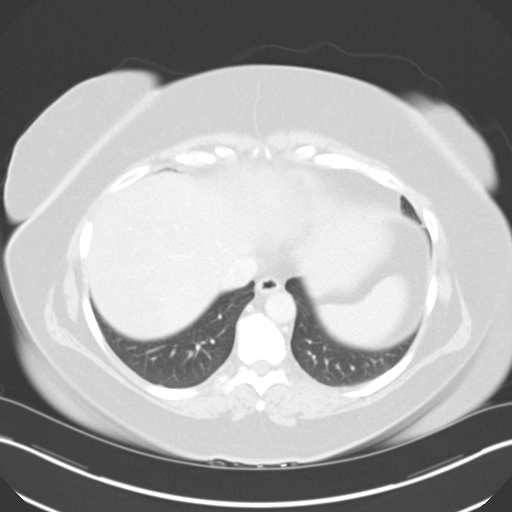
[im 91/97  soft-tissue]
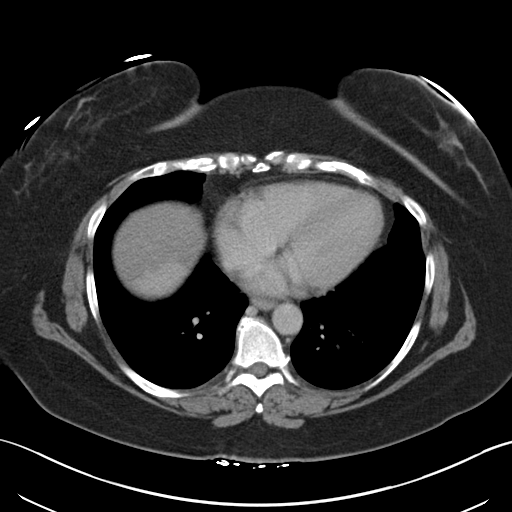
[im 91/97  lung]
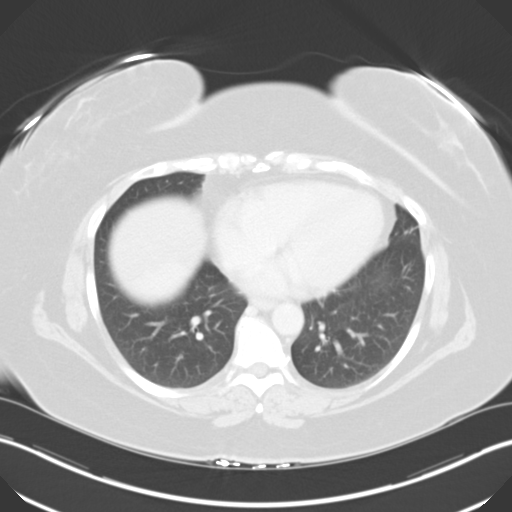

[13 of 32 positions shown; findings below may reference images not displayed]

FINDINGS: Sagittal images of the spine shows mild degenerative changes lower
thoracic spine. Mild hepatic fatty infiltration. Lung bases are
unremarkable. No calcified gallstones are noted within gallbladder.
The pancreas, spleen and adrenal glands are unremarkable. Kidneys
are symmetrical in size and enhancement. Delayed renal images shows
bilateral renal symmetrical excretion. There is mild fullness of
left renal collecting system without frank hydronephrosis. Mild
distension of left ureter.

No aortic aneurysm. No small bowel obstruction. No free abdominal
air. No abdominal ascites. The patient is status post right
hemicolectomy. Some stool noted in distal colon. No small bowel or
colonic obstruction.

Scattered diverticula are noted sigmoid colon. In axial image 74
there is mild stranding of pericolonic fat in mid sigmoid colon in
left pelvis. Small amount of fluid is noted at this level. Findings
are highly suspicious for diverticulitis or focal mesenteric fat
inflammation. No pericolonic abscess is noted. There is mild
prominence of the left ureter at this level. Inflammatory changes
within left ureter cannot be excluded probable from adjacent
pericolonic inflammation. The uterus is hyper anteflexed. Mild
distended urinary bladder.
IMPRESSION: 1. Scattered diverticula are noted sigmoid colon. There is mild
stranding of pericolonic fat in mid sigmoid colon in left pelvis.
Small amount of pericolonic fluid. Findings highly suspicious for
diverticulitis or focal mesenteric panniculitis. No pericolonic
abscess. Mild prominence of left ureter at this level may be due to
satellite inflammatory changes.
2. Mild distended urinary bladder.
3. Hyper flexed uterus.
4. Status post right hemicolectomy.
Critical Value/emergent results were called by telephone at the time
of interpretation on 12/12/2012 at [DATE] to Dr.FAZE GALANTE ,
who verbally acknowledged these results.

## 2015-10-08 NOTE — H&P (Signed)
Shantika Accardo  DICTATION # [id number] CSN# [id number]   Margarette Asal, MD 10/08/2015 9:19 AM

## 2015-10-09 NOTE — H&P (Signed)
NAMEJAKLYN, Lauren Norris NO.:  [id number]  MEDICAL RECORD NO.:  [id number]  LOCATION:                                 FACILITY:  PHYSICIAN:  Ralene Bathe. Matthew Saras, M.D.DATE OF BIRTH:  [birth year]  DATE OF ADMISSION:  10/16/2015 DATE OF DISCHARGE:                             HISTORY & PHYSICAL   CHIEF COMPLAINT:  Endometrial polyp, postmenopausal bleeding.  HISTORY OF PRESENT ILLNESS:  A 50 year old postmenopausal patient, who has been on HRT, has experienced some irregular bleeding, underwent endometrial biopsy in 10/2014 that showed fragments of benign endometrial-type polyp.  She has continued to have some bleeding.  SHG was carried out in the office recently in 09/2015 showing a normal size uterus, adnexa unremarkable, very anterior cervix, but there did appear to be a polyp noted on saline infusion, presents now for d and C, hysteroscopy with MyoSure.  This procedure including specific risks related to bleeding, infection, adjacent organ injury, need for additional surgery from other complications such as perforation were discussed with her.  PAST MEDICAL HISTORY:  ALLERGIES:  Codeine.  OPERATIONS:  Cesarean section.  She has had a partial colon resection for early colon cancer.  REVIEW OF SYSTEMS:  Significant for diverticulitis.  MEDICATIONS:  HRT, Minivelle/Prometrium, HCTZ 25 mg daily.  FAMILY HISTORY:  Significant for migraine, heart disease, gallbladder disease, UTI, osteoporosis, diverticulosis, arthritis, diabetes, lung scan, uterine and colon cancer.  SOCIAL HISTORY:  Denies tobacco or drug use.  She does drink alcohol socially.  She is married.  Last Pap in 07/2013 was normal.  PHYSICAL EXAMINATION:  VITAL SIGNS:  Temp 98.2, blood pressure 130/82. HEENT:  Unremarkable. NECK:  Supple without masses. LUNGS:  Clear. CARDIOVASCULAR:  Regular rate and rhythm without murmurs, rubs, or gallops. BREASTS:  Without masses. ABDOMEN:  Soft, flat,  nontender. GU:  Vulva, vagina, cervix normal.  Uterus was upper limit of normal size.  Adnexa negative. EXTREMITIES:  Unremarkable. NEUROLOGIC:  Unremarkable.  IMPRESSION:  Postmenopausal bleeding, endometrial polyp.  PLAN:  D and C hysteroscopy with MyoSure resection, procedure and risks discussed as above.     Tymir Terral M. Matthew Saras, M.D.   ______________________________ Ralene Bathe. Matthew Saras, M.D.    RM[facility]/MEDQ  D:  10/08/2015  T:  10/09/2015  Job:  [id number]

## 2015-10-14 ENCOUNTER — Encounter (HOSPITAL_BASED_OUTPATIENT_CLINIC_OR_DEPARTMENT_OTHER): Payer: Self-pay | Admitting: *Deleted

## 2015-10-14 NOTE — Progress Notes (Signed)
NPO AFTER MN.  ARRIVE AT 0700.  NEEDS ISTAT AND EKG.

## 2015-10-16 ENCOUNTER — Ambulatory Visit (HOSPITAL_BASED_OUTPATIENT_CLINIC_OR_DEPARTMENT_OTHER): Payer: Medicaid Other | Admitting: Anesthesiology

## 2015-10-16 ENCOUNTER — Ambulatory Visit (HOSPITAL_BASED_OUTPATIENT_CLINIC_OR_DEPARTMENT_OTHER)
Admission: RE | Admit: 2015-10-16 | Discharge: 2015-10-16 | Disposition: A | Discharge status: Home / Self Care | Payer: Medicaid Other | Source: Ambulatory Visit | Attending: Obstetrics and Gynecology | Admitting: Obstetrics and Gynecology

## 2015-10-16 ENCOUNTER — Other Ambulatory Visit: Payer: Self-pay

## 2015-10-16 ENCOUNTER — Encounter (HOSPITAL_BASED_OUTPATIENT_CLINIC_OR_DEPARTMENT_OTHER): Payer: Self-pay

## 2015-10-16 ENCOUNTER — Encounter (HOSPITAL_BASED_OUTPATIENT_CLINIC_OR_DEPARTMENT_OTHER)
Admission: RE | Disposition: A | Discharge status: Home / Self Care | Payer: Self-pay | Source: Ambulatory Visit | Attending: Obstetrics and Gynecology

## 2015-10-16 DIAGNOSIS — Z6841 Body Mass Index (BMI) 40.0 and over, adult: Secondary | ICD-10-CM | POA: Insufficient documentation

## 2015-10-16 DIAGNOSIS — N840 Polyp of corpus uteri: Secondary | ICD-10-CM | POA: Insufficient documentation

## 2015-10-16 DIAGNOSIS — M5431 Sciatica, right side: Secondary | ICD-10-CM | POA: Insufficient documentation

## 2015-10-16 DIAGNOSIS — N950 Postmenopausal bleeding: Secondary | ICD-10-CM | POA: Insufficient documentation

## 2015-10-16 HISTORY — DX: Personal history of other diseases of the digestive system: Z87.19

## 2015-10-16 HISTORY — DX: Diverticulosis of large intestine without perforation or abscess without bleeding: K57.30

## 2015-10-16 HISTORY — DX: Palpitations: R00.2

## 2015-10-16 HISTORY — DX: Postmenopausal bleeding: N95.0

## 2015-10-16 HISTORY — DX: Polyp of corpus uteri: N84.0

## 2015-10-16 HISTORY — PX: DILATATION & CURETTAGE/HYSTEROSCOPY WITH MYOSURE: SHX6511

## 2015-10-16 HISTORY — DX: Personal history of colonic polyps: Z86.010

## 2015-10-16 HISTORY — DX: Personal history of adenomatous and serrated colon polyps: Z86.0101

## 2015-10-16 LAB — POCT I-STAT 4, (NA,K, GLUC, HGB,HCT)
Glucose, Bld: 115 mg/dL — ABNORMAL HIGH (ref 65–99)
HEMATOCRIT: 42 % (ref 36.0–46.0)
HEMOGLOBIN: 14.3 g/dL (ref 12.0–15.0)
Potassium: 3.8 mmol/L (ref 3.5–5.1)
SODIUM: 142 mmol/L (ref 135–145)

## 2015-10-16 SURGERY — DILATATION & CURETTAGE/HYSTEROSCOPY WITH MYOSURE
Anesthesia: General | Site: Uterus

## 2015-10-16 MED ORDER — OXYCODONE HCL 5 MG PO TABS
ORAL_TABLET | ORAL | Status: AC
  Filled 2015-10-16: qty 1

## 2015-10-16 MED ORDER — FENTANYL CITRATE (PF) 100 MCG/2ML IJ SOLN
INTRAMUSCULAR | Status: AC
  Filled 2015-10-16: qty 2

## 2015-10-16 MED ORDER — KETOROLAC TROMETHAMINE 30 MG/ML IJ SOLN
INTRAMUSCULAR | Status: AC
  Filled 2015-10-16: qty 1

## 2015-10-16 MED ORDER — PROPOFOL 10 MG/ML IV BOLUS
INTRAVENOUS | Status: AC
  Filled 2015-10-16: qty 20

## 2015-10-16 MED ORDER — LIDOCAINE HCL 1 % IJ SOLN
INTRAMUSCULAR | Status: DC | PRN
  Administered 2015-10-16: 7 mL

## 2015-10-16 MED ORDER — ONDANSETRON HCL 4 MG/2ML IJ SOLN
INTRAMUSCULAR | Status: DC | PRN
  Administered 2015-10-16: 4 mg via INTRAVENOUS

## 2015-10-16 MED ORDER — DEXAMETHASONE SODIUM PHOSPHATE 10 MG/ML IJ SOLN
INTRAMUSCULAR | Status: AC
  Filled 2015-10-16: qty 1

## 2015-10-16 MED ORDER — FENTANYL CITRATE (PF) 100 MCG/2ML IJ SOLN
25.0000 ug | INTRAMUSCULAR | Status: DC | PRN
  Filled 2015-10-16: qty 1

## 2015-10-16 MED ORDER — LACTATED RINGERS IV SOLN
INTRAVENOUS | Status: DC
  Administered 2015-10-16: 08:00:00 via INTRAVENOUS
  Filled 2015-10-16: qty 1000

## 2015-10-16 MED ORDER — ONDANSETRON HCL 4 MG/2ML IJ SOLN
INTRAMUSCULAR | Status: AC
  Filled 2015-10-16: qty 2

## 2015-10-16 MED ORDER — LIDOCAINE 2% (20 MG/ML) 5 ML SYRINGE
INTRAMUSCULAR | Status: DC | PRN
  Administered 2015-10-16: 100 mg via INTRAVENOUS

## 2015-10-16 MED ORDER — MIDAZOLAM HCL 2 MG/2ML IJ SOLN
INTRAMUSCULAR | Status: AC
  Filled 2015-10-16: qty 2

## 2015-10-16 MED ORDER — ACETAMINOPHEN 500 MG PO TABS
ORAL_TABLET | ORAL | Status: AC
  Filled 2015-10-16: qty 2

## 2015-10-16 MED ORDER — ACETAMINOPHEN 500 MG PO TABS
1000.0000 mg | ORAL_TABLET | Freq: Once | ORAL | Status: AC
  Administered 2015-10-16: 1000 mg via ORAL
  Filled 2015-10-16: qty 2

## 2015-10-16 MED ORDER — LIDOCAINE 2% (20 MG/ML) 5 ML SYRINGE
INTRAMUSCULAR | Status: AC
  Filled 2015-10-16: qty 5

## 2015-10-16 MED ORDER — IBUPROFEN 200 MG PO TABS: 800.0000 mg | ORAL_TABLET | Freq: Three times a day (TID) | ORAL | 0 refills | Status: AC | PRN

## 2015-10-16 MED ORDER — MIDAZOLAM HCL 5 MG/5ML IJ SOLN
INTRAMUSCULAR | Status: DC | PRN
  Administered 2015-10-16: 2 mg via INTRAVENOUS

## 2015-10-16 MED ORDER — PROPOFOL 10 MG/ML IV BOLUS
INTRAVENOUS | Status: DC | PRN
  Administered 2015-10-16: 30 mg via INTRAVENOUS
  Administered 2015-10-16: 200 mg via INTRAVENOUS

## 2015-10-16 MED ORDER — SODIUM CHLORIDE 0.9 % IR SOLN
Status: DC | PRN
  Administered 2015-10-16: 3000 mL

## 2015-10-16 MED ORDER — ARTIFICIAL TEARS OP OINT
TOPICAL_OINTMENT | OPHTHALMIC | Status: AC
  Filled 2015-10-16: qty 3.5

## 2015-10-16 MED ORDER — DEXAMETHASONE SODIUM PHOSPHATE 4 MG/ML IJ SOLN
INTRAMUSCULAR | Status: DC | PRN
  Administered 2015-10-16: 10 mg via INTRAVENOUS

## 2015-10-16 MED ORDER — OXYCODONE-ACETAMINOPHEN 2.5-325 MG PO TABS: 1.0000 | ORAL_TABLET | ORAL | 0 refills | Status: DC | PRN

## 2015-10-16 MED ORDER — PROMETHAZINE HCL 25 MG/ML IJ SOLN
6.2500 mg | INTRAMUSCULAR | Status: DC | PRN
  Filled 2015-10-16: qty 1

## 2015-10-16 MED ORDER — OXYCODONE HCL 5 MG PO TABS
5.0000 mg | ORAL_TABLET | Freq: Once | ORAL | Status: AC
  Administered 2015-10-16: 5 mg via ORAL
  Filled 2015-10-16: qty 1

## 2015-10-16 MED ORDER — FENTANYL CITRATE (PF) 100 MCG/2ML IJ SOLN
INTRAMUSCULAR | Status: DC | PRN
  Administered 2015-10-16: 25 ug via INTRAVENOUS
  Administered 2015-10-16: 50 ug via INTRAVENOUS
  Administered 2015-10-16: 25 ug via INTRAVENOUS

## 2015-10-16 SURGICAL SUPPLY — 29 items
CANISTER SUCTION 2500CC (MISCELLANEOUS) ×2 IMPLANT
CATH ROBINSON RED A/P 16FR (CATHETERS) ×2 IMPLANT
COVER BACK TABLE 60X90IN (DRAPES) ×2 IMPLANT
DEVICE MYOSURE LITE (MISCELLANEOUS) IMPLANT
DEVICE MYOSURE REACH (MISCELLANEOUS) IMPLANT
DRAPE HYSTEROSCOPY (DRAPE) ×2 IMPLANT
DRAPE LG THREE QUARTER DISP (DRAPES) ×2 IMPLANT
DRSG TELFA 3X8 NADH (GAUZE/BANDAGES/DRESSINGS) ×2 IMPLANT
FILTER ARTHROSCOPY CONVERTOR (FILTER) ×2 IMPLANT
GLOVE BIO SURGEON STRL SZ 6.5 (GLOVE) ×1 IMPLANT
GLOVE BIO SURGEON STRL SZ7 (GLOVE) ×4 IMPLANT
GLOVE INDICATOR 6.5 STRL GRN (GLOVE) ×1 IMPLANT
GOWN STRL REUS W/ TWL XL LVL3 (GOWN DISPOSABLE) IMPLANT
GOWN STRL REUS W/TWL LRG LVL3 (GOWN DISPOSABLE) ×3 IMPLANT
GOWN STRL REUS W/TWL XL LVL3 (GOWN DISPOSABLE) ×2
IV NS IRRIG 3000ML ARTHROMATIC (IV SOLUTION) ×2 IMPLANT
KIT ROOM TURNOVER WOR (KITS) ×2 IMPLANT
LEGGING LITHOTOMY PAIR STRL (DRAPES) ×2 IMPLANT
PACK BASIN DAY SURGERY FS (CUSTOM PROCEDURE TRAY) ×2 IMPLANT
PAD DRESSING TELFA 3X8 NADH (GAUZE/BANDAGES/DRESSINGS) IMPLANT
PAD OB MATERNITY 4.3X12.25 (PERSONAL CARE ITEMS) ×2 IMPLANT
PAD PREP 24X48 CUFFED NSTRL (MISCELLANEOUS) ×2 IMPLANT
SEAL ROD LENS SCOPE MYOSURE (ABLATOR) ×2 IMPLANT
TOWEL OR 17X24 6PK STRL BLUE (TOWEL DISPOSABLE) ×4 IMPLANT
TRAY DSU PREP LF (CUSTOM PROCEDURE TRAY) ×2 IMPLANT
TUBE CONNECTING 12X1/4 (SUCTIONS) IMPLANT
TUBING AQUILEX INFLOW (TUBING) ×2 IMPLANT
TUBING AQUILEX OUTFLOW (TUBING) ×2 IMPLANT
WATER STERILE IRR 500ML POUR (IV SOLUTION) ×2 IMPLANT

## 2015-10-16 NOTE — Anesthesia Postprocedure Evaluation (Signed)
Anesthesia Post Note  Patient: Solon Augusta  Procedure(s) Performed: Procedure(s) (LRB): DILATATION & CURETTAGE/HYSTEROSCOPY (N/A)  Patient location during evaluation: PACU Anesthesia Type: General Level of consciousness: awake and alert Pain management: pain level controlled Vital Signs Assessment: post-procedure vital signs reviewed and stable Respiratory status: spontaneous breathing, nonlabored ventilation, respiratory function stable and patient connected to nasal cannula oxygen Cardiovascular status: blood pressure returned to baseline and stable Postop Assessment: no signs of nausea or vomiting Anesthetic complications: no    Last Vitals:  Vitals:   10/16/15 0930 10/16/15 0958  BP: (!) 134/96 (!) 139/97  Pulse: 72 70  Resp: 14 16  Temp:  36.6 C    Last Pain:  Vitals:   10/16/15 0945  TempSrc:   PainSc: 6                  Yemariam Magar J

## 2015-10-16 NOTE — Op Note (Signed)
Preoperative diagnosis: Perimenopausal bleeding, possible endometrial polyp  Postoperative diagnosis: Same  Procedure: D&C hysteroscopy  Surgeon: Matthew Saras  Anesthesia: Gen.  EBL: Less than 50 cc  Procedure and findings:  Patient taken the operating room after an adequate level of general anesthesia was obtained the patient's leg stirrups the perineum and vagina were prepped and draped bladder was drained. EUA revealed that the cervix, as noted before, was extremely anterior, due to this fact and her body habitus, exposure was difficult. With a weighted speculum, paracervical block was then created by infiltrating at 3 and 9:00 submucosally 5-7 cc 1% plain Xylocaine at each site after negative aspiration. Uterus sounded to 10 cm, progressively dilated to a 25 Pratt dilator. The continuous flow hysteroscope was inserted but due to the angle and positioning, very difficult to see up into the fundus, the lower segment of shaggy endometrium was noted. Sharp curettage was then carried out moderate amount of tissue was removed and sent to pathology. The scope was reinserted the cavity was irrigated still not an excellent view of the fundus due to the camera positioning and the anterior location of her uterus. There was minimal bleeding no further the procedure was terminated at that point. She tolerated this well went to recovery room in good condition.  Dictated with HintonD.

## 2015-10-16 NOTE — Discharge Instructions (Signed)

## 2015-10-16 NOTE — Progress Notes (Signed)
The patient was re-examined with no change in status 

## 2015-10-16 NOTE — Transfer of Care (Signed)
Last Vitals:  Vitals:   10/16/15 0717 10/16/15 0902  BP: (!) 156/92 (!) 127/94  Pulse: 88   Resp: 18 18  Temp: 36.6 C 36.4 C    Last Pain:  Vitals:   10/16/15 0724  TempSrc:   PainSc: 4       Patients Stated Pain Goal: 8 (10/16/15 0724)  Immediate Anesthesia Transfer of Care Note  Patient: Lauren Norris  Procedure(s) Performed: Procedure(s) (LRB): DILATATION & CURETTAGE/HYSTEROSCOPY (N/A)  Patient Location: PACU  Anesthesia Type: General  Level of Consciousness: awake, alert  and oriented  Airway & Oxygen Therapy: Patient Spontanous Breathing and Patient connected to nasal cannula  oxygen  Post-op Assessment: Report given to PACU RN and Post -op Vital signs reviewed and stable  Post vital signs: Reviewed and stable  Complications: No apparent anesthesia complications

## 2015-10-16 NOTE — Anesthesia Preprocedure Evaluation (Addendum)
Anesthesia Evaluation  Patient identified by MRN, date of birth, ID band Patient awake    Reviewed: Allergy & Precautions, NPO status , Patient's Chart, lab work & pertinent test results  Airway Mallampati: II  TM Distance: >3 FB Neck ROM: Full    Dental no notable dental hx.    Pulmonary neg pulmonary ROS,    Pulmonary exam normal breath sounds clear to auscultation       Cardiovascular negative cardio ROS Normal cardiovascular exam Rhythm:Regular Rate:Normal     Neuro/Psych  Headaches, Right sciatica negative psych ROS   GI/Hepatic negative GI ROS, Neg liver ROS,   Endo/Other  Morbid obesity  Renal/GU negative Renal ROS  negative genitourinary   Musculoskeletal negative musculoskeletal ROS (+)   Abdominal (+) + obese,   Peds negative pediatric ROS (+)  Hematology negative hematology ROS (+)   Anesthesia Other Findings   Reproductive/Obstetrics negative OB ROS                             Anesthesia Physical Anesthesia Plan  ASA: III  Anesthesia Plan: MAC   Post-op Pain Management:    Induction: Intravenous  Airway Management Planned:   Additional Equipment:   Intra-op Plan:   Post-operative Plan: Extubation in OR  Informed Consent: I have reviewed the patients History and Physical, chart, labs and discussed the procedure including the risks, benefits and alternatives for the proposed anesthesia with the patient or authorized representative who has indicated his/her understanding and acceptance.   Dental advisory given  Plan Discussed with: CRNA  Anesthesia Plan Comments: (Scheduled choice. Patient prefers MAC with GA backup.)       Anesthesia Quick Evaluation

## 2015-10-16 NOTE — Anesthesia Procedure Notes (Signed)
Procedure Name: LMA Insertion Date/Time: 10/16/2015 8:24 AM Performed by: Franne Grip Pre-anesthesia Checklist: Patient identified, Emergency Drugs available, Suction available and Patient being monitored Patient Re-evaluated:Patient Re-evaluated prior to inductionOxygen Delivery Method: Circle system utilized Preoxygenation: Pre-oxygenation with 100% oxygen Intubation Type: IV induction Ventilation: Mask ventilation without difficulty LMA: LMA inserted LMA Size: 4.0 Number of attempts: 1 Airway Equipment and Method: Bite block Placement Confirmation: positive ETCO2 Tube secured with: Tape Dental Injury: Teeth and Oropharynx as per pre-operative assessment

## 2015-10-17 ENCOUNTER — Encounter (HOSPITAL_BASED_OUTPATIENT_CLINIC_OR_DEPARTMENT_OTHER): Payer: Self-pay | Admitting: Obstetrics and Gynecology

## 2015-12-25 NOTE — H&P (Signed)
Lauren Norris  DICTATION # [id number] CSN# [id number]   Margarette Asal, MD 12/25/2015 12:07 PM

## 2015-12-26 NOTE — H&P (Signed)
NAME:  , [name]                           ACCOUNT NO.:  MEDICAL RECORD NO.:  LOCATION:                                 FACILITY:  PHYSICIAN:  Dina Warbington M. Matthew Saras, M.D.    DATE OF BIRTH:  DATE OF ADMISSION: DATE OF DISCHARGE:                             HISTORY & PHYSICAL   CHIEF COMPLAINT:  Abnormal perimenopausal bleeding.  HISTORY OF PRESENT ILLNESS:  A 50 year old, G2, P1.  FSH in 2010 was 81. Her mother went through early menopause and she has been on HRT with alleviation of her symptoms since that time.  Due to some irregular bleeding, she underwent ultrasound and endometrial biopsy that was in 2012.  The pathology from the endometrial biopsy showed scanty inactive endometrium.  No hyperplasia noted.  She continued to do well on Vivelle and Prometrium until earlier 2017 when she developed some irregular bleeding and underwent saline infusion ultrasound, it was technically difficult because of the way her uterus was anteflexed, cervical stenosis, etc.  We recommended D and C, hysteroscopy, which was carried out on October 23, 2015.  The pathology from that D and C showed fragments of endometrium with benign endometrial type polyp formation. No hyperplasia or atypia.  She wants to continue with her HRT, which has been 0.05 Vivelle and Prometrium, but she has continued to experience some irregular bleeding.  We reviewed a number of options with her including a Mirena IUD, endometrial ablation, or even completely stopping her HRT.  She prefers to schedule NovaSure EMA.  This procedure including specific risks related to bleeding, infection, adjacent organ injury, uterine perforation that may require additional surgery were all reviewed with her.  Printed information was given for her to review also.  PAST MEDICAL HISTORY:  Allergies, none.  CURRENT MEDICATIONS:  Vivelle dot and Prometrium.  PRIOR SURGERIES: She has had a prior C-section, 2 D and Cs, history of a  partial colectomy that was in 2014.  FAMILY HISTORY:  Significant for history of uterine, colon, and lung cancer.  SOCIAL HISTORY:  Denies tobacco or drug use.  She does drink alcohol socially.  She is married.  PHYSICAL EXAMINATION:  VITAL SIGNS:  Temperature 98.2, blood pressure 140/90. HEENT:  Unremarkable. NECK:  Supple without masses. LUNGS:  Clear. CARDIOVASCULAR:  Regular rate and rhythm without murmurs, rubs, or gallops noted. BREASTS:  Without masses.  Last mammogram was 2015 which was normal, that we will have her follow up as scheduled. ABDOMEN:  Soft, flat, nontender.  Exam difficult due to her weight of 250. GU:  Vulva, vagina, cervix normal.  Cervix is anterior.  Uterus upper limits of normal size.  Adnexa negative. EXTREMITIES:  Unremarkable. NEUROLOGIC:  Unremarkable.  ASSESSMENT:  Perimenopausal bleeding, on HRT after negative D and C this year.  PLAN:  Hysteroscopy with NovaSure EMA.  Procedure and risks reviewed as above.     [name] M. Matthew Saras, M.D.     [facility]/MEDQ  D:  12/25/2015  T:  12/26/2015  Job:  [id number]

## 2016-01-06 NOTE — Patient Instructions (Signed)
Your procedure is scheduled on:  Tuesday, Dec. 26, 2017  Enter through the [employer] [facility] at:  6:00 AM  Pick up the phone at the desk and dial [phone].  Call this number if you have problems the morning of surgery: [phone].  Remember: Do NOT eat food or drink after:  Midnight Monday  Take these medicines the morning of surgery with a SIP OF WATER:  Hydrochlorothiazide  Stop ALL herbal medications at this time   Do NOT wear jewelry (body piercing), metal hair clips/bobby pins, make-up, or nail polish. Do NOT wear lotions, powders, or perfumes.  You may wear deodorant. Do NOT shave for 48 hours prior to surgery. Do NOT bring valuables to the hospital. Contacts, dentures, or bridgework may not be worn into surgery.  Have a responsible adult drive you home and stay with you for 24 hours after your procedure

## 2016-01-07 ENCOUNTER — Encounter (HOSPITAL_COMMUNITY): Payer: Self-pay

## 2016-01-07 ENCOUNTER — Encounter (HOSPITAL_COMMUNITY)
Admission: RE | Admit: 2016-01-07 | Discharge: 2016-01-07 | Disposition: A | Discharge status: Home / Self Care | Payer: Medicaid Other | Source: Ambulatory Visit | Attending: Obstetrics and Gynecology | Admitting: Obstetrics and Gynecology

## 2016-01-07 DIAGNOSIS — Z01812 Encounter for preprocedural laboratory examination: Secondary | ICD-10-CM | POA: Diagnosis present

## 2016-01-07 HISTORY — DX: Essential (primary) hypertension: I10

## 2016-01-07 LAB — CBC
HCT: 43.1 % (ref 36.0–46.0)
Hemoglobin: 14.6 g/dL (ref 12.0–15.0)
MCH: 28.6 pg (ref 26.0–34.0)
MCHC: 33.9 g/dL (ref 30.0–36.0)
MCV: 84.5 fL (ref 78.0–100.0)
PLATELETS: 331 10*3/uL (ref 150–400)
RBC: 5.1 MIL/uL (ref 3.87–5.11)
RDW: 12.9 % (ref 11.5–15.5)
WBC: 8.1 10*3/uL (ref 4.0–10.5)

## 2016-01-07 LAB — BASIC METABOLIC PANEL
Anion gap: 10 (ref 5–15)
BUN: 15 mg/dL (ref 6–20)
CALCIUM: 9 mg/dL (ref 8.9–10.3)
CO2: 26 mmol/L (ref 22–32)
CREATININE: 0.8 mg/dL (ref 0.44–1.00)
Chloride: 100 mmol/L — ABNORMAL LOW (ref 101–111)
GFR calc Af Amer: >60 mL/min (ref >60)
Glucose, Bld: 118 mg/dL — ABNORMAL HIGH (ref 65–99)
POTASSIUM: 3.5 mmol/L (ref 3.5–5.1)
SODIUM: 136 mmol/L (ref 135–145)

## 2016-01-13 MED ORDER — CEFOTETAN DISODIUM 2 G IJ SOLR
2.0000 g | INTRAMUSCULAR | Status: AC
  Filled 2016-01-13: qty 2

## 2016-01-14 ENCOUNTER — Encounter (HOSPITAL_COMMUNITY): Payer: Self-pay

## 2016-01-14 ENCOUNTER — Ambulatory Visit (HOSPITAL_COMMUNITY)
Admission: RE | Admit: 2016-01-14 | Discharge: 2016-01-14 | Disposition: A | Discharge status: Home / Self Care | Payer: Medicaid Other | Source: Ambulatory Visit | Attending: Obstetrics and Gynecology | Admitting: Obstetrics and Gynecology

## 2016-01-14 ENCOUNTER — Ambulatory Visit (HOSPITAL_COMMUNITY): Payer: Medicaid Other | Admitting: Anesthesiology

## 2016-01-14 ENCOUNTER — Encounter (HOSPITAL_COMMUNITY)
Admission: RE | Disposition: A | Discharge status: Home / Self Care | Payer: Self-pay | Source: Ambulatory Visit | Attending: Obstetrics and Gynecology

## 2016-01-14 DIAGNOSIS — Z79899 Other long term (current) drug therapy: Secondary | ICD-10-CM | POA: Insufficient documentation

## 2016-01-14 DIAGNOSIS — Z800 Family history of malignant neoplasm of digestive organs: Secondary | ICD-10-CM | POA: Diagnosis not present

## 2016-01-14 DIAGNOSIS — Z9049 Acquired absence of other specified parts of digestive tract: Secondary | ICD-10-CM | POA: Insufficient documentation

## 2016-01-14 DIAGNOSIS — I10 Essential (primary) hypertension: Secondary | ICD-10-CM | POA: Diagnosis not present

## 2016-01-14 DIAGNOSIS — N938 Other specified abnormal uterine and vaginal bleeding: Secondary | ICD-10-CM | POA: Insufficient documentation

## 2016-01-14 DIAGNOSIS — Z8049 Family history of malignant neoplasm of other genital organs: Secondary | ICD-10-CM | POA: Insufficient documentation

## 2016-01-14 DIAGNOSIS — N924 Excessive bleeding in the premenopausal period: Secondary | ICD-10-CM | POA: Diagnosis present

## 2016-01-14 DIAGNOSIS — Z801 Family history of malignant neoplasm of trachea, bronchus and lung: Secondary | ICD-10-CM | POA: Diagnosis not present

## 2016-01-14 DIAGNOSIS — Z79890 Hormone replacement therapy (postmenopausal): Secondary | ICD-10-CM | POA: Diagnosis not present

## 2016-01-14 DIAGNOSIS — Z9889 Other specified postprocedural states: Secondary | ICD-10-CM | POA: Insufficient documentation

## 2016-01-14 HISTORY — PX: DILITATION & CURRETTAGE/HYSTROSCOPY WITH NOVASURE ABLATION: SHX5568

## 2016-01-14 LAB — PREGNANCY, URINE: Preg Test, Ur: NEGATIVE

## 2016-01-14 SURGERY — DILATATION & CURETTAGE/HYSTEROSCOPY WITH NOVASURE ABLATION
Anesthesia: General

## 2016-01-14 MED ORDER — LIDOCAINE HCL 1 % IJ SOLN
INTRAMUSCULAR | Status: AC
  Filled 2016-01-14: qty 20

## 2016-01-14 MED ORDER — FENTANYL CITRATE (PF) 100 MCG/2ML IJ SOLN
INTRAMUSCULAR | Status: AC
  Administered 2016-01-14: 50 ug via INTRAVENOUS
  Filled 2016-01-14: qty 2

## 2016-01-14 MED ORDER — KETOROLAC TROMETHAMINE 30 MG/ML IJ SOLN
INTRAMUSCULAR | Status: AC
Start: 2016-01-14 — End: 2016-01-14
  Filled 2016-01-14: qty 1

## 2016-01-14 MED ORDER — LIDOCAINE HCL (CARDIAC) 20 MG/ML IV SOLN
INTRAVENOUS | Status: AC
  Filled 2016-01-14: qty 5

## 2016-01-14 MED ORDER — ONDANSETRON HCL 4 MG/2ML IJ SOLN
INTRAMUSCULAR | Status: AC
  Filled 2016-01-14: qty 2

## 2016-01-14 MED ORDER — FENTANYL CITRATE (PF) 100 MCG/2ML IJ SOLN
INTRAMUSCULAR | Status: AC
  Filled 2016-01-14: qty 2

## 2016-01-14 MED ORDER — MIDAZOLAM HCL 2 MG/2ML IJ SOLN
INTRAMUSCULAR | Status: AC
  Filled 2016-01-14: qty 2

## 2016-01-14 MED ORDER — FENTANYL CITRATE (PF) 100 MCG/2ML IJ SOLN
INTRAMUSCULAR | Status: DC | PRN
  Administered 2016-01-14: 50 ug via INTRAVENOUS

## 2016-01-14 MED ORDER — PROMETHAZINE HCL 25 MG/ML IJ SOLN: 6.2500 mg | INTRAMUSCULAR | Status: DC | PRN

## 2016-01-14 MED ORDER — LIDOCAINE HCL (CARDIAC) 20 MG/ML IV SOLN
INTRAVENOUS | Status: DC | PRN
  Administered 2016-01-14: 100 mg via INTRAVENOUS

## 2016-01-14 MED ORDER — DEXAMETHASONE SODIUM PHOSPHATE 10 MG/ML IJ SOLN
INTRAMUSCULAR | Status: DC | PRN
  Administered 2016-01-14: 4 mg via INTRAVENOUS

## 2016-01-14 MED ORDER — ONDANSETRON HCL 4 MG/2ML IJ SOLN
INTRAMUSCULAR | Status: DC | PRN
  Administered 2016-01-14: 4 mg via INTRAVENOUS

## 2016-01-14 MED ORDER — KETOROLAC TROMETHAMINE 30 MG/ML IJ SOLN
INTRAMUSCULAR | Status: DC | PRN
  Administered 2016-01-14: 30 mg via INTRAVENOUS

## 2016-01-14 MED ORDER — PROPOFOL 10 MG/ML IV BOLUS
INTRAVENOUS | Status: AC
  Filled 2016-01-14: qty 20

## 2016-01-14 MED ORDER — LIDOCAINE HCL 1 % IJ SOLN
INTRAMUSCULAR | Status: DC | PRN
  Administered 2016-01-14: 6 mL

## 2016-01-14 MED ORDER — FENTANYL CITRATE (PF) 100 MCG/2ML IJ SOLN
25.0000 ug | INTRAMUSCULAR | Status: DC | PRN
  Administered 2016-01-14 (×3): 50 ug via INTRAVENOUS

## 2016-01-14 MED ORDER — DEXAMETHASONE SODIUM PHOSPHATE 4 MG/ML IJ SOLN
INTRAMUSCULAR | Status: AC
  Filled 2016-01-14: qty 1

## 2016-01-14 MED ORDER — DEXTROSE 5 % IV SOLN
INTRAVENOUS | Status: DC | PRN
  Administered 2016-01-14: 2 g via INTRAVENOUS

## 2016-01-14 MED ORDER — SODIUM CHLORIDE 0.9 % IR SOLN
Status: DC | PRN
  Administered 2016-01-14: 3000 mL

## 2016-01-14 MED ORDER — MIDAZOLAM HCL 2 MG/2ML IJ SOLN
INTRAMUSCULAR | Status: DC | PRN
  Administered 2016-01-14: 2 mg via INTRAVENOUS

## 2016-01-14 MED ORDER — KETOROLAC TROMETHAMINE 30 MG/ML IJ SOLN: 30.0000 mg | Freq: Once | INTRAMUSCULAR | Status: DC | PRN

## 2016-01-14 MED ORDER — PROPOFOL 10 MG/ML IV BOLUS
INTRAVENOUS | Status: DC | PRN
  Administered 2016-01-14: 200 mg via INTRAVENOUS

## 2016-01-14 MED ORDER — SCOPOLAMINE 1 MG/3DAYS TD PT72
MEDICATED_PATCH | TRANSDERMAL | Status: AC
  Administered 2016-01-14: 1.5 mg via TRANSDERMAL
  Filled 2016-01-14: qty 1

## 2016-01-14 MED ORDER — LACTATED RINGERS IV SOLN
INTRAVENOUS | Status: DC
  Administered 2016-01-14: 07:00:00 via INTRAVENOUS

## 2016-01-14 MED ORDER — OXYCODONE-ACETAMINOPHEN 2.5-325 MG PO TABS
1.0000 | ORAL_TABLET | ORAL | 0 refills | Status: AC | PRN
Start: 2016-01-14 — End: ?

## 2016-01-14 MED ORDER — SCOPOLAMINE 1 MG/3DAYS TD PT72
1.0000 | MEDICATED_PATCH | Freq: Once | TRANSDERMAL | Status: DC
  Administered 2016-01-14: 1.5 mg via TRANSDERMAL

## 2016-01-14 SURGICAL SUPPLY — 14 items
ABLATOR ENDOMETRIAL BIPOLAR (ABLATOR) ×3 IMPLANT
CANISTER SUCT 3000ML (MISCELLANEOUS) ×3 IMPLANT
CLOTH BEACON ORANGE TIMEOUT ST (SAFETY) ×3 IMPLANT
CONTAINER PREFILL 10% NBF 60ML (FORM) ×6 IMPLANT
GLOVE BIO SURGEON STRL SZ7 (GLOVE) ×3 IMPLANT
GLOVE BIOGEL PI IND STRL 7.0 (GLOVE) ×1 IMPLANT
GLOVE BIOGEL PI INDICATOR 7.0 (GLOVE) ×2
GOWN STRL REUS W/TWL LRG LVL3 (GOWN DISPOSABLE) ×9 IMPLANT
PACK VAGINAL MINOR WOMEN LF (CUSTOM PROCEDURE TRAY) ×3 IMPLANT
PAD OB MATERNITY 4.3X12.25 (PERSONAL CARE ITEMS) ×3 IMPLANT
TOWEL OR 17X24 6PK STRL BLUE (TOWEL DISPOSABLE) ×6 IMPLANT
TUBING AQUILEX INFLOW (TUBING) ×3 IMPLANT
TUBING AQUILEX OUTFLOW (TUBING) ×3 IMPLANT
WATER STERILE IRR 1000ML POUR (IV SOLUTION) ×3 IMPLANT

## 2016-01-14 NOTE — Anesthesia Preprocedure Evaluation (Addendum)
Anesthesia Evaluation  Patient identified by MRN, date of birth, ID band Patient awake    Reviewed: Allergy & Precautions, NPO status , Patient's Chart, lab work & pertinent test results  Airway Mallampati: III  TM Distance: >3 FB Neck ROM: Full    Dental  (+) Dental Advisory Given   Pulmonary neg pulmonary ROS,    breath sounds clear to auscultation       Cardiovascular hypertension, Pt. on medications  Rhythm:Regular Rate:Normal     Neuro/Psych  Headaches,    GI/Hepatic negative GI ROS, Neg liver ROS,   Endo/Other  Morbid obesity  Renal/GU negative Renal ROS     Musculoskeletal   Abdominal   Peds  Hematology negative hematology ROS (+)   Anesthesia Other Findings   Reproductive/Obstetrics                            Lab Results  Component Value Date   WBC 8.1 01/07/2016   HGB 14.6 01/07/2016   HCT 43.1 01/07/2016   MCV 84.5 01/07/2016   PLT 331 01/07/2016   Lab Results  Component Value Date   CREATININE 0.80 01/07/2016   BUN 15 01/07/2016   NA 136 01/07/2016   K 3.5 01/07/2016   CL 100 (L) 01/07/2016   CO2 26 01/07/2016    Anesthesia Physical Anesthesia Plan  ASA: III  Anesthesia Plan: General   Post-op Pain Management:    Induction: Intravenous  Airway Management Planned: LMA  Additional Equipment:   Intra-op Plan:   Post-operative Plan: Extubation in OR  Informed Consent: I have reviewed the patients History and Physical, chart, labs and discussed the procedure including the risks, benefits and alternatives for the proposed anesthesia with the patient or authorized representative who has indicated his/her understanding and acceptance.   Dental advisory given  Plan Discussed with: CRNA  Anesthesia Plan Comments:         Anesthesia Quick Evaluation

## 2016-01-14 NOTE — Transfer of Care (Signed)
Immediate Anesthesia Transfer of Care Note  Patient: Lauren Norris  Procedure(s) Performed: Procedure(s): DILATATION & CURETTAGE/HYSTEROSCOPY WITH Failed NOVASURE ABLATION Attempt (N/A)  Patient Location: PACU  Anesthesia Type:General  Level of Consciousness: awake, alert  and oriented  Airway & Oxygen Therapy: Patient Spontanous Breathing and Patient connected to nasal cannula oxygen  Post-op Assessment: Report given to RN, Post -op Vital signs reviewed and stable and Patient moving all extremities  Post vital signs: Reviewed and stable  Last Vitals:  Vitals:   01/14/16 0623  BP: (!) 158/99  Pulse: 85  Resp: 16  Temp: 36.7 C    Last Pain:  Vitals:   01/14/16 0623  TempSrc: Oral      Patients Stated Pain Goal: 3 ([id number])  Complications: No apparent anesthesia complications

## 2016-01-14 NOTE — Op Note (Signed)
Preoperative diagnosis: Postmenopausal bleeding  Postoperative diagnosis: Same  Procedure: D&C, hysteroscopy, failed NovaSure ablation  Surgeon: Matthew Saras  Anesthesia: Gen. EBL: Less than 25 cc  Procedure and findings:  Patient taken the operating room after an adequate level of general anesthesia was obtained the patient's legs in stirrups the perineum and vagina were prepped and draped the bladder was drained appropriate timeouts were taken at that point. The legs extended speculum was positioned, as noted on her prior D&C hysteroscopy cervix is somewhat deformed and flush with the vagina, visualization of the cervix was difficult also some extremely anteflexed uterus. Using different weighted speculums and then different size Graves speculum, I could finally put a tenaculum on the anterior cervical lip, uterus sounded to 10 cm with a cervical length of 3.5 was able to dilate sufficiently to 27 [employer] dilator this would allow for visualization with the hysteroscope with upper cavity minimal blood noted, sharp curettage was carried out specimen submitted as endometrial curettings. Various attempts with dilating and over dilated cervix to allow placement of the NovaSure device were unsuccessful think because of the sharp anteflexed position of the uterus. Procedure was terminated when multiple attempts could not get the device into the fundus.  She tolerated this well went to recovery room in good condition.  Dictated with Lodge PoleD.

## 2016-01-14 NOTE — Anesthesia Postprocedure Evaluation (Signed)
Anesthesia Post Note  Patient: Solon Augusta  Procedure(s) Performed: Procedure(s) (LRB): DILATATION & CURETTAGE/HYSTEROSCOPY WITH Failed NOVASURE ABLATION Attempt (N/A)  Patient location during evaluation: PACU Anesthesia Type: General Level of consciousness: awake and alert Pain management: pain level controlled Vital Signs Assessment: post-procedure vital signs reviewed and stable Respiratory status: spontaneous breathing, nonlabored ventilation, respiratory function stable and patient connected to nasal cannula oxygen Cardiovascular status: blood pressure returned to baseline and stable Postop Assessment: no signs of nausea or vomiting Anesthetic complications: no        Last Vitals:  Vitals:   01/14/16 0900 01/14/16 0930  BP: 134/83 (!) 132/100  Pulse: 78 80  Resp: 14 16  Temp:  36.4 C    Last Pain:  Vitals:   01/14/16 0930  TempSrc:   PainSc: 2    Pain Goal: Patients Stated Pain Goal: 3 (01/14/16 [id number])               Tiajuana Amass

## 2016-01-14 NOTE — Discharge Instructions (Signed)
DISCHARGE INSTRUCTIONS: D&C  The following instructions have been prepared to help you care for yourself upon your return home.   **You may begin taking Ibuprofen(Motrin,Advil) or Aleve after 2:07pm today**  Personal hygiene:  Use sanitary pads for vaginal drainage, not tampons.  Shower the day after your procedure.  NO tub baths, pools or Jacuzzis for 2-3 weeks.  Wipe front to back after using the bathroom.  Activity and limitations:  Do NOT drive or operate any equipment for 24 hours. The effects of anesthesia are still present and drowsiness may result.  Do NOT rest in bed all day.  Walking is encouraged.  Walk up and down stairs slowly.  You may resume your normal activity in one to two days or as indicated by your physician.  Sexual activity: NO intercourse for at least 2 weeks after the procedure, or as indicated by your physician.  Diet: Eat a light meal as desired this evening. You may resume your usual diet tomorrow.  Return to work: You may resume your work activities in one to two days or as indicated by your doctor.  What to expect after your surgery: Expect to have vaginal bleeding/discharge for 2-3 days and spotting for up to 10 days. It is not unusual to have soreness for up to 1-2 weeks. You may have a slight burning sensation when you urinate for the first day. Mild cramps may continue for a couple of days. You may have a regular period in 2-6 weeks.  Call your doctor for any of the following:  Excessive vaginal bleeding, saturating and changing one pad every hour.  Inability to urinate 6 hours after discharge from hospital.  Pain not relieved by pain medication.  Fever of 100.4 F or greater.  Unusual vaginal discharge or odor.    Patients signature: ______________________  Nurses signature ________________________  Support person's signature_______________________

## 2016-01-14 NOTE — Progress Notes (Signed)
The patient was re-examined with no change in status 

## 2016-01-15 ENCOUNTER — Encounter (HOSPITAL_COMMUNITY): Payer: Self-pay | Admitting: Obstetrics and Gynecology
